# Patient Record
Sex: Male | Born: 1965 | Race: White | Hispanic: No | Smoking: Never smoker
Health system: Southern US, Community
[De-identification: ages and names within clinical notes are randomized; demographics above are authoritative.]

## PROBLEM LIST (undated history)

## (undated) DIAGNOSIS — I10 Essential (primary) hypertension: Secondary | ICD-10-CM

## (undated) DIAGNOSIS — K529 Noninfective gastroenteritis and colitis, unspecified: Secondary | ICD-10-CM

## (undated) DIAGNOSIS — K760 Fatty (change of) liver, not elsewhere classified: Secondary | ICD-10-CM

## (undated) DIAGNOSIS — R0989 Other specified symptoms and signs involving the circulatory and respiratory systems: Secondary | ICD-10-CM

## (undated) DIAGNOSIS — N281 Cyst of kidney, acquired: Secondary | ICD-10-CM

## (undated) DIAGNOSIS — R1904 Left lower quadrant abdominal swelling, mass and lump: Secondary | ICD-10-CM

## (undated) DIAGNOSIS — R51 Headache: Secondary | ICD-10-CM

## (undated) DIAGNOSIS — K5790 Diverticulosis of intestine, part unspecified, without perforation or abscess without bleeding: Secondary | ICD-10-CM

## (undated) DIAGNOSIS — I82409 Acute embolism and thrombosis of unspecified deep veins of unspecified lower extremity: Secondary | ICD-10-CM

## (undated) DIAGNOSIS — R519 Headache, unspecified: Secondary | ICD-10-CM

## (undated) DIAGNOSIS — I2699 Other pulmonary embolism without acute cor pulmonale: Secondary | ICD-10-CM

## (undated) DIAGNOSIS — M7730 Calcaneal spur, unspecified foot: Secondary | ICD-10-CM

## (undated) DIAGNOSIS — R42 Dizziness and giddiness: Secondary | ICD-10-CM

## (undated) DIAGNOSIS — E669 Obesity, unspecified: Secondary | ICD-10-CM

## (undated) HISTORY — PX: OTHER SURGICAL HISTORY: SHX169

---

## 2008-04-24 ENCOUNTER — Ambulatory Visit: Payer: Self-pay | Admitting: Internal Medicine

## 2008-05-07 ENCOUNTER — Ambulatory Visit: Payer: Self-pay | Admitting: General Surgery

## 2008-05-07 ENCOUNTER — Ambulatory Visit: Payer: Self-pay | Admitting: Internal Medicine

## 2008-05-11 ENCOUNTER — Ambulatory Visit: Payer: Self-pay | Admitting: General Surgery

## 2009-10-25 ENCOUNTER — Ambulatory Visit: Payer: Self-pay | Admitting: Internal Medicine

## 2013-03-28 ENCOUNTER — Ambulatory Visit: Payer: Self-pay | Admitting: Family Medicine

## 2013-05-01 ENCOUNTER — Ambulatory Visit: Payer: Self-pay | Admitting: Physician Assistant

## 2014-07-06 ENCOUNTER — Emergency Department: Payer: Self-pay | Admitting: Emergency Medicine

## 2014-07-07 ENCOUNTER — Telehealth (HOSPITAL_BASED_OUTPATIENT_CLINIC_OR_DEPARTMENT_OTHER): Payer: Self-pay | Admitting: Emergency Medicine

## 2014-12-08 ENCOUNTER — Other Ambulatory Visit: Payer: Self-pay | Admitting: Family Medicine

## 2014-12-08 ENCOUNTER — Ambulatory Visit
Admission: RE | Admit: 2014-12-08 | Discharge: 2014-12-08 | Disposition: A | Discharge status: Home / Self Care | Payer: Managed Care, Other (non HMO) | Source: Ambulatory Visit | Attending: Family Medicine | Admitting: Family Medicine

## 2014-12-08 DIAGNOSIS — R42 Dizziness and giddiness: Secondary | ICD-10-CM | POA: Insufficient documentation

## 2014-12-08 MED ORDER — GADOBENATE DIMEGLUMINE 529 MG/ML IV SOLN
20.0000 mL | Freq: Once | INTRAVENOUS | Status: AC | PRN
  Administered 2014-12-08: 20 mL via INTRAVENOUS

## 2014-12-12 ENCOUNTER — Encounter: Payer: Self-pay | Admitting: Emergency Medicine

## 2014-12-12 ENCOUNTER — Inpatient Hospital Stay
Admission: EM | Admit: 2014-12-12 | Discharge: 2014-12-15 | DRG: 418 | Disposition: A | Discharge status: Home / Self Care | Payer: Managed Care, Other (non HMO) | Attending: Internal Medicine | Admitting: Internal Medicine

## 2014-12-12 ENCOUNTER — Emergency Department: Payer: Managed Care, Other (non HMO)

## 2014-12-12 DIAGNOSIS — E669 Obesity, unspecified: Secondary | ICD-10-CM | POA: Diagnosis present

## 2014-12-12 DIAGNOSIS — Z6841 Body Mass Index (BMI) 40.0 and over, adult: Secondary | ICD-10-CM

## 2014-12-12 DIAGNOSIS — Z86711 Personal history of pulmonary embolism: Secondary | ICD-10-CM | POA: Diagnosis not present

## 2014-12-12 DIAGNOSIS — K821 Hydrops of gallbladder: Secondary | ICD-10-CM

## 2014-12-12 DIAGNOSIS — K819 Cholecystitis, unspecified: Secondary | ICD-10-CM | POA: Diagnosis present

## 2014-12-12 DIAGNOSIS — K8050 Calculus of bile duct without cholangitis or cholecystitis without obstruction: Secondary | ICD-10-CM

## 2014-12-12 DIAGNOSIS — R791 Abnormal coagulation profile: Secondary | ICD-10-CM | POA: Diagnosis present

## 2014-12-12 DIAGNOSIS — K8010 Calculus of gallbladder with chronic cholecystitis without obstruction: Principal | ICD-10-CM | POA: Diagnosis present

## 2014-12-12 DIAGNOSIS — R1013 Epigastric pain: Secondary | ICD-10-CM

## 2014-12-12 DIAGNOSIS — K8000 Calculus of gallbladder with acute cholecystitis without obstruction: Secondary | ICD-10-CM | POA: Diagnosis not present

## 2014-12-12 DIAGNOSIS — K8020 Calculus of gallbladder without cholecystitis without obstruction: Secondary | ICD-10-CM | POA: Diagnosis present

## 2014-12-12 DIAGNOSIS — Z7901 Long term (current) use of anticoagulants: Secondary | ICD-10-CM | POA: Diagnosis not present

## 2014-12-12 DIAGNOSIS — Z86718 Personal history of other venous thrombosis and embolism: Secondary | ICD-10-CM | POA: Diagnosis not present

## 2014-12-12 HISTORY — DX: Dizziness and giddiness: R42

## 2014-12-12 HISTORY — DX: Other pulmonary embolism without acute cor pulmonale: I26.99

## 2014-12-12 LAB — COMPREHENSIVE METABOLIC PANEL
ALT: 29 U/L (ref 17–63)
AST: 28 U/L (ref 15–41)
Albumin: 3.7 g/dL (ref 3.5–5.0)
Alkaline Phosphatase: 60 U/L (ref 38–126)
Anion gap: 8 (ref 5–15)
BUN: 18 mg/dL (ref 6–20)
CO2: 28 mmol/L (ref 22–32)
CREATININE: 0.75 mg/dL (ref 0.61–1.24)
Calcium: 9.1 mg/dL (ref 8.9–10.3)
Chloride: 102 mmol/L (ref 101–111)
GFR calc Af Amer: >60 mL/min (ref >60)
GFR calc non Af Amer: >60 mL/min (ref >60)
Glucose, Bld: 120 mg/dL — ABNORMAL HIGH (ref 65–99)
Potassium: 4.3 mmol/L (ref 3.5–5.1)
SODIUM: 138 mmol/L (ref 135–145)
TOTAL PROTEIN: 7.3 g/dL (ref 6.5–8.1)
Total Bilirubin: 0.5 mg/dL (ref 0.3–1.2)

## 2014-12-12 LAB — URINALYSIS COMPLETE WITH MICROSCOPIC (ARMC ONLY)
BILIRUBIN URINE: NEGATIVE
Bacteria, UA: NONE SEEN
Glucose, UA: NEGATIVE mg/dL
HGB URINE DIPSTICK: NEGATIVE
KETONES UR: NEGATIVE mg/dL
Nitrite: NEGATIVE
Protein, ur: NEGATIVE mg/dL
Specific Gravity, Urine: 1.01 (ref 1.005–1.030)
pH: 6 (ref 5.0–8.0)

## 2014-12-12 LAB — HEPARIN LEVEL (UNFRACTIONATED): HEPARIN UNFRACTIONATED: 0.35 [IU]/mL (ref 0.30–0.70)

## 2014-12-12 LAB — HEMOGLOBIN A1C: Hgb A1c MFr Bld: 6 % (ref 4.0–6.0)

## 2014-12-12 LAB — CBC WITH DIFFERENTIAL/PLATELET
Basophils Absolute: 0 10*3/uL (ref 0–0.1)
Basophils Relative: 0 %
EOS ABS: 0.3 10*3/uL (ref 0–0.7)
Eosinophils Relative: 3 %
HCT: 45.8 % (ref 40.0–52.0)
HEMOGLOBIN: 15.2 g/dL (ref 13.0–18.0)
LYMPHS ABS: 4.1 10*3/uL — AB (ref 1.0–3.6)
Lymphocytes Relative: 36 %
MCH: 27.7 pg (ref 26.0–34.0)
MCHC: 33.1 g/dL (ref 32.0–36.0)
MCV: 83.7 fL (ref 80.0–100.0)
Monocytes Absolute: 1 10*3/uL (ref 0.2–1.0)
Monocytes Relative: 8 %
Neutro Abs: 6.1 10*3/uL (ref 1.4–6.5)
Neutrophils Relative %: 53 %
Platelets: 236 10*3/uL (ref 150–440)
RBC: 5.47 MIL/uL (ref 4.40–5.90)
RDW: 14.1 % (ref 11.5–14.5)
WBC: 11.6 10*3/uL — ABNORMAL HIGH (ref 3.8–10.6)

## 2014-12-12 LAB — PROTIME-INR
INR: 2.28
PROTHROMBIN TIME: 25.3 s — AB (ref 11.4–15.0)

## 2014-12-12 LAB — TROPONIN I: Troponin I: <0.03 ng/mL (ref <0.031)

## 2014-12-12 LAB — LIPASE, BLOOD: LIPASE: 29 U/L (ref 22–51)

## 2014-12-12 LAB — TSH: TSH: 4.211 u[IU]/mL (ref 0.350–4.500)

## 2014-12-12 MED ORDER — ACETAMINOPHEN 650 MG RE SUPP: 650.0000 mg | Freq: Four times a day (QID) | RECTAL | Status: DC | PRN

## 2014-12-12 MED ORDER — WARFARIN SODIUM 5 MG PO TABS: 8.0000 mg | ORAL_TABLET | ORAL | Status: DC

## 2014-12-12 MED ORDER — HEPARIN BOLUS VIA INFUSION
4000.0000 [IU] | Freq: Once | INTRAVENOUS | Status: AC
  Administered 2014-12-12: 4000 [IU] via INTRAVENOUS
  Filled 2014-12-12: qty 4000

## 2014-12-12 MED ORDER — ACETAMINOPHEN 325 MG PO TABS
650.0000 mg | ORAL_TABLET | Freq: Four times a day (QID) | ORAL | Status: DC | PRN
  Administered 2014-12-12: 650 mg via ORAL
  Filled 2014-12-12: qty 2

## 2014-12-12 MED ORDER — HEPARIN (PORCINE) IN NACL 100-0.45 UNIT/ML-% IJ SOLN
2000.0000 [IU]/h | INTRAMUSCULAR | Status: DC
  Administered 2014-12-12 – 2014-12-14 (×4): 2000 [IU]/h via INTRAVENOUS
  Filled 2014-12-12 (×10): qty 250

## 2014-12-12 MED ORDER — ONDANSETRON HCL 4 MG PO TABS: 4.0000 mg | ORAL_TABLET | Freq: Four times a day (QID) | ORAL | Status: DC | PRN

## 2014-12-12 MED ORDER — DOCUSATE SODIUM 100 MG PO CAPS
100.0000 mg | ORAL_CAPSULE | Freq: Two times a day (BID) | ORAL | Status: DC
Start: 2014-12-12 — End: 2014-12-15
  Administered 2014-12-12 – 2014-12-14 (×5): 100 mg via ORAL
  Filled 2014-12-12 (×5): qty 1

## 2014-12-12 MED ORDER — MORPHINE SULFATE 2 MG/ML IJ SOLN
2.0000 mg | INTRAMUSCULAR | Status: DC | PRN
  Administered 2014-12-12 – 2014-12-15 (×2): 2 mg via INTRAVENOUS
  Filled 2014-12-12 (×2): qty 1

## 2014-12-12 MED ORDER — GI COCKTAIL ~~LOC~~
30.0000 mL | Freq: Once | ORAL | Status: AC
  Administered 2014-12-12: 30 mL via ORAL
  Filled 2014-12-12: qty 30

## 2014-12-12 MED ORDER — ONDANSETRON HCL 4 MG/2ML IJ SOLN
4.0000 mg | Freq: Once | INTRAMUSCULAR | Status: AC
  Administered 2014-12-12: 4 mg via INTRAVENOUS
  Filled 2014-12-12: qty 2

## 2014-12-12 MED ORDER — MORPHINE SULFATE 4 MG/ML IJ SOLN
4.0000 mg | Freq: Once | INTRAMUSCULAR | Status: AC
  Administered 2014-12-12: 4 mg via INTRAVENOUS
  Filled 2014-12-12: qty 1

## 2014-12-12 MED ORDER — WARFARIN SODIUM 2 MG PO TABS: 4.0000 mg | ORAL_TABLET | ORAL | Status: DC

## 2014-12-12 MED ORDER — SODIUM CHLORIDE 0.9 % IV SOLN
INTRAVENOUS | Status: DC
  Administered 2014-12-12 – 2014-12-14 (×4): via INTRAVENOUS
  Administered 2014-12-14: 1000 mL via INTRAVENOUS
  Administered 2014-12-14 – 2014-12-15 (×2): via INTRAVENOUS

## 2014-12-12 MED ORDER — WARFARIN SODIUM 5 MG PO TABS: 8.0000 mg | ORAL_TABLET | Freq: Every day | ORAL | Status: DC

## 2014-12-12 MED ORDER — ONDANSETRON HCL 4 MG/2ML IJ SOLN
4.0000 mg | Freq: Four times a day (QID) | INTRAMUSCULAR | Status: DC | PRN
  Administered 2014-12-15: 4 mg via INTRAVENOUS

## 2014-12-12 NOTE — ED Notes (Signed)
Diamond MD at bedside. 

## 2014-12-12 NOTE — ED Notes (Signed)
Report received from Rachel RN.  

## 2014-12-12 NOTE — Consult Note (Signed)
CC: RUQ pain  HPI: Russell Walters is a pleasant 49 yo M with a history of PE on coumadin who presents with approx 11 hours of acute onset RUQ pain.  Has never had before.  Has improved with morphine.  No fevers/chills, night sweats, shortness of breath, cough, chest pain, nausea/vomiting, diarrhea/constipation, dysuria/hematuria.  No sick contacts, no unusual ingestions.  Active Ambulatory Problems    Diagnosis Date Noted  . No Active Ambulatory Problems   Resolved Ambulatory Problems    Diagnosis Date Noted  . No Resolved Ambulatory Problems   Past Medical History  Diagnosis Date  . Pulmonary embolism   . Vertigo      Medication List    ASK your doctor about these medications        WARFARIN SODIUM PO  Take 4 mg by mouth every Friday.     WARFARIN SODIUM PO  Take 8 mg by mouth daily. Take every day except Friday       Family History  Problem Relation Age of Onset  . Diabetes Mellitus II Maternal Grandmother    History   Social History  . Marital Status: Divorced    Spouse Name: N/A  . Number of Children: N/A  . Years of Education: N/A   Occupational History  . Not on file.   Social History Main Topics  . Smoking status: Never Smoker   . Smokeless tobacco: Not on file  . Alcohol Use: 0.0 oz/week    0 Standard drinks or equivalent per week     Comment: Occasional  . Drug Use: No  . Sexual Activity: Not on file   Other Topics Concern  . Not on file   Social History Narrative   Lives with his wife   No Known Allergies   ROS: Full ROS obtained, pertinent positives and negatives as above.  Blood pressure 140/82, pulse 53, temperature 97.8 F (36.6 C), temperature source Oral, resp. rate 17, height  (1.854 m), weight 162.841 kg (359 lb), SpO2 99 %. GEN: NAD/A&Ox3 FACE: no obvious facial trauma, normal external nose, normal external ears EYES: no scleral icterus, no conjunctivitis HEAD: normocephalic atraumatic CV: RRR, no MRG RESP: moving air  well, lungs clear ABD: soft, mild tender to palp RUQ, nondistended EXT: moving all ext well, strength 5/5 NEURO: cnII-XII grossly intact, sensation intact all 4 ext  Labs: Personally reviewed, significant for WBC 11.6, 53% neutrophils INR: Not drawn  Imaging: reviewed, significant for nonmobile stone at neck of gallbladder  A/P 49 yo with likely gallbladder hydrops (in contrast to choledocholithiasis, which has been stated in ER report and H and P.  There is no cbd dilatation or elevated bili, which would be seen with choledocholithiasis).  Have recommended laparoscopic cholecystectomy.  However, on coumadin and INR not known.  Will draw INR, would like to have below 1.6 prior to OR.  If > 1.6, can allow to eat and will make NPO after midnight and check again.

## 2014-12-12 NOTE — Progress Notes (Signed)
[facility] -  at [facility]   PATIENT NAME: Russell Walters    MR#:  [id number]  DATE OF BIRTH:  1965-07-01  SUBJECTIVE:  CHIEF COMPLAINT:   Chief Complaint  Patient presents with  . Abdominal Pain    Pt presents to ER alert and in NAD. Pt states epigastric pain that started this evening. Pt denies n/v/d. Resp even and unlabored.  RUQ pain  REVIEW OF SYSTEMS:  CONSTITUTIONAL: No fever, fatigue or weakness.  EYES: No blurred or double vision.  EARS, NOSE, AND THROAT: No tinnitus or ear pain.  RESPIRATORY: No cough, shortness of breath, wheezing or hemoptysis.  CARDIOVASCULAR: No chest pain, orthopnea, edema.  GASTROINTESTINAL: No nausea, vomiting, diarrhea but has RUQ abdominal pain.  GENITOURINARY: No dysuria, hematuria.  ENDOCRINE: No polyuria, nocturia,  HEMATOLOGY: No anemia, easy bruising or bleeding SKIN: No rash or lesion. MUSCULOSKELETAL: No joint pain or arthritis.   NEUROLOGIC: No tingling, numbness, weakness.  PSYCHIATRY: No anxiety or depression.   DRUG ALLERGIES:  No Known Allergies  VITALS:  Blood pressure 140/82, pulse 53, temperature 97.8 F (36.6 C), temperature source Oral, resp. rate 17, height 6\' 1"  (1.854 m), weight 162.841 kg (359 lb), SpO2 99 %.  PHYSICAL EXAMINATION:  GENERAL:  49 y.o.-year-old patient lying in the bed with no acute distress.  EYES: Pupils equal, round, reactive to light and accommodation. No scleral icterus. Extraocular muscles intact.  HEENT: Head atraumatic, normocephalic. Oropharynx and nasopharynx clear.  NECK:  Supple, no jugular venous distention. No thyroid enlargement, no tenderness.  LUNGS: Normal breath sounds bilaterally, no wheezing, rales,rhonchi or crepitation. No use of accessory muscles of respiration.  CARDIOVASCULAR: S1, S2 normal. No murmurs, rubs, or gallops.  ABDOMEN: Soft, tenderness on RUQ,  nondistended. Bowel sounds present. No organomegaly or mass.  EXTREMITIES: No pedal  edema, cyanosis, or clubbing.  NEUROLOGIC: Cranial nerves II through XII are intact. Muscle strength 5/5 in all extremities. Sensation intact. Gait not checked.  PSYCHIATRIC: The patient is alert and oriented x 3.  SKIN: No obvious rash, lesion, or ulcer.    LABORATORY PANEL:   CBC  Recent Labs Lab 12/12/14 0210  WBC 11.6*  HGB 15.2  HCT 45.8  PLT 236   ------------------------------------------------------------------------------------------------------------------  Chemistries   Recent Labs Lab 12/12/14 0210  NA 138  K 4.3  CL 102  CO2 28  GLUCOSE 120*  BUN 18  CREATININE 0.75  CALCIUM 9.1  AST 28  ALT 29  ALKPHOS 60  BILITOT 0.5   ------------------------------------------------------------------------------------------------------------------  Cardiac Enzymes  Recent Labs Lab 12/12/14 0210  TROPONINI <0.03   ------------------------------------------------------------------------------------------------------------------  RADIOLOGY:  US Abdomen Limited Ruq  12/12/2014   CLINICAL DATA:  Epigastric pain for 6 hours.  EXAM: US ABDOMEN LIMITED - RIGHT UPPER QUADRANT  COMPARISON:  None.  FINDINGS: Gallbladder:  Non mobile gallstone in the neck measures 1.5 cm. No wall thickening, wall thickness of 2.4 mm. No sonographic Eulah Pont sign noted, however patient has been administered pain medication.  Common bile duct:  Diameter: 4 mm  Liver:  No focal lesion identified. Diffusely increased and heterogeneous in parenchymal echogenicity.  Technically limited and difficult exam due to large body habitus.  IMPRESSION: 1. Non mobile gallstone in the neck. No wall thickening. Negative sonographic Murphy sign, however limited assessment given administration of pain medication. No biliary dilatation. Nuclear medicine HIDA scan could be considered for further evaluation if there is clinical concern for acute cholecystitis. 2. Hepatic steatosis.   Electronically Signed  By: Rubye Oaks M.D.   On: 12/12/2014 05:22    EKG:   Orders placed or performed during the hospital encounter of 12/12/14  . ED EKG  . ED EKG    ASSESSMENT AND PLAN:   Cholelithiasis.  would like to have INR below 1.6 and then get laparoscopic cholecystectomy per Dr. Juliann Pulse.  H/O PE. D/c coumadin, start heparin drip. F/u PTT, INR, D/c heparin drip before surgery.   D/w Dr. Juliann Pulse.  All the records are reviewed and case discussed with Care Management/Social Workerr. Management plans discussed with the patient, family and they are in agreement.  CODE STATUS: Full code.  TOTAL TIME TAKING CARE OF THIS PATIENT: 39  minutes.   POSSIBLE D/C IN 3 DAYS, DEPENDING ON CLINICAL CONDITION.   Shaune Pollack M.D on 12/12/2014 at 2:35 PM  Between 7am to 6pm - Pager - [phone]  After 6pm go to [email or web address] - password [email or web address]  [facility]  Office  [phone]  CC: Primary care physician; Rayetta Humphrey, MD

## 2014-12-12 NOTE — ED Notes (Signed)
Pt presents to ER alert and in NAD. Pt states epigastric pain that started this evening. Pt denies n/v/d. Resp even and unlabored.

## 2014-12-12 NOTE — ED Provider Notes (Signed)
[facility] Emergency Department Provider Note  ____________________________________________  Time seen: Approximately 245 AM  I have reviewed the triage vital signs and the nursing notes.   HISTORY  Chief Complaint Abdominal Pain    HPI Russell Walters is a 49 y.o. male who comes in with abdominal pain. The patient reports that he was walking his dogs at 2230 when he started having some epigastric pain. The patient reports it as a squeezing pain that goes from 5-10 out of 10 pain. The patient reports that he feels pressure in his upper stomach and in his chest. The symptoms seem worse when he lays on his right side. The patient tried some Pepto-Bismol and Tylenol without any relief. The patient was able to eat dinner without having any difficulty or pain at the time. The patient reports that he does have some radiation into his back and into his side. He has never had this pain before and he is concerned he came into the hospital for further evaluation.   Past Medical History  Diagnosis Date  . Pulmonary embolism   . Vertigo     There are no active problems to display for this patient.   History reviewed. No pertinent past surgical history.  Current Outpatient Rx  Name  Route  Sig  Dispense  Refill  . WARFARIN SODIUM PO   Oral   Take 4 mg by mouth every Friday.         Barron Alvine SODIUM PO   Oral   Take 8 mg by mouth daily. Take every day except Friday           Allergies Review of patient's allergies indicates no known allergies.  History reviewed. No pertinent family history.  Social History History  Substance Use Topics  . Smoking status: Never Smoker   . Smokeless tobacco: Not on file  . Alcohol Use: No    Review of Systems Constitutional: No fever/chills Eyes: No visual changes. ENT: No sore throat. Cardiovascular: chest pain. Respiratory:  shortness of breath. Gastrointestinal: Abdominal pain and nausea no vomiting.   No diarrhea.  No constipation. Genitourinary: Negative for dysuria. Musculoskeletal: back pain. Skin: Negative for rash. Neurological: Negative for headaches, focal weakness or numbness.  10-point ROS otherwise negative.  ____________________________________________   PHYSICAL EXAM:  VITAL SIGNS: ED Triage Vitals  Enc Vitals Group     BP 12/12/14 0226 152/90 mmHg     Pulse Rate 12/12/14 0226 68     Resp 12/12/14 0226 21     Temp 12/12/14 0226 98.1 F (36.7 C)     Temp Source 12/12/14 0226 Oral     SpO2 12/12/14 0226 96 %     Weight --      Height --      Head Cir --      Peak Flow --      Pain Score 12/12/14 0216 5     Pain Loc --      Pain Edu? --      Excl. in GC? --     Constitutional: Alert and oriented. Well appearing and in no acute distress. Eyes: Conjunctivae are normal. PERRL. EOMI. Head: Atraumatic. Nose: No congestion/rhinnorhea. Mouth/Throat: Mucous membranes are moist.  Oropharynx non-erythematous. Cardiovascular: Normal rate, regular rhythm. Grossly normal heart sounds.  Good peripheral circulation. Respiratory: Normal respiratory effort.  No retractions. Lungs CTAB. Gastrointestinal: Soft with epigastric tenderness to palpation, no distention, positive bowel sounds Genitourinary: Deferred Musculoskeletal: No lower extremity tenderness nor edema.  Neurologic:  Normal speech and language.  Skin:  Skin is warm, dry and intact.  Psychiatric: Mood and affect are normal.   ____________________________________________   LABS (all labs ordered are listed, but only abnormal results are displayed)  Labs Reviewed  COMPREHENSIVE METABOLIC PANEL - Abnormal; Notable for the following:    Glucose, Bld 120 (*)    All other components within normal limits  CBC WITH DIFFERENTIAL/PLATELET - Abnormal; Notable for the following:    WBC 11.6 (*)    Lymphs Abs 4.1 (*)    All other components within normal limits  URINALYSIS COMPLETEWITH MICROSCOPIC ([facility] ONLY) -  Abnormal; Notable for the following:    Color, Urine YELLOW (*)    APPearance CLEAR (*)    Leukocytes, UA TRACE (*)    Squamous Epithelial / LPF 0-5 (*)    All other components within normal limits  LIPASE, BLOOD  TROPONIN I   ____________________________________________  EKG  ED ECG REPORT I, Rebecka Apley, the attending physician, personally viewed and interpreted this ECG.   Date: 12/12/2014  EKG Time: 200  Rate: 74  Rhythm: normal EKG, normal sinus rhythm, unchanged from previous tracings, normal sinus rhythm  Axis: normal  Intervals:none  ST&T Change: none  ____________________________________________  RADIOLOGY  Abdominal ultrasound: Nonmobile gallstone in the neck, no wall thickening, negative sonographic Murphy sign, nuclear medicine HIDA scan could be considered for further evaluation if there is clinical concern for acute cholecystitis. ____________________________________________   PROCEDURES  Procedure(s) performed: None  Critical Care performed: No  ____________________________________________   INITIAL IMPRESSION / ASSESSMENT AND PLAN / ED COURSE  Pertinent labs & imaging results that were available during my care of the patient were reviewed by me and considered in my medical decision making (see chart for details).  The patient is a 49 year old male who comes in with abdominal pain and has a nonmobile stone in his gallbladder neck. At this time the patient appears to have choledocholithiasis. I will admit him to the hospital for an ERCP and further evaluation by GI and surgery. The patient did receive some morphine and he reports that his pain is improved at this time. I discussed the patient's case with the hospitalist who will admit the patient to the hospital service. ____________________________________________   FINAL CLINICAL IMPRESSION(S) / ED DIAGNOSES  Final diagnoses:  Epigastric pain  Choledocholithiasis      Rebecka Apley, MD 12/12/14 [phone]

## 2014-12-12 NOTE — Progress Notes (Addendum)
ANTICOAGULATION CONSULT NOTE - Initial Consult  Pharmacy Consult for Heparin  Indication: DVT  No Known Allergies  Patient Measurements: Height:  (185.4 cm) Weight: (!) 359 lb (162.841 kg) IBW/kg (Calculated) : 79.9 Heparin Dosing Weight: 118.8 kg  Vital Signs: Temp: 97.8 F (36.6 C) (07/30 0848) Temp Source: Oral (07/30 0848) BP: 140/82 mmHg (07/30 0848) Pulse Rate: 53 (07/30 0848)  Labs:  Recent Labs  12/12/14 0210  HGB 15.2  HCT 45.8  PLT 236  LABPROT 25.3*  INR 2.28  CREATININE 0.75  TROPONINI <0.03    Estimated Creatinine Clearance: 182.6 mL/min (by C-G formula based on Cr of 0.75).   Medical History: Past Medical History  Diagnosis Date  . Pulmonary embolism   . Vertigo     Medications:  Prescriptions prior to admission  Medication Sig Dispense Refill Last Dose  . WARFARIN SODIUM PO Take 4 mg by mouth every Friday.   12/11/2014 at 2300  . WARFARIN SODIUM PO Take 8 mg by mouth daily. Take every day except Friday   Past Week at Unknown time    Assessment: Pt with VTE  Goal of Therapy:  Heparin level 0.3-0.7 units/ml Monitor platelets by anticoagulation protocol: Yes   Plan:  Will order Heparin 4000 units IV X 1 bolus. Will start Heparin drip 2000 units/hr.  7/30:  HL @ 19:00 = 0.35  Will continue this pt on current rate of 2000 units/hr.  Will draw confirmation HL 6 hrs after 1st level on 7/31 @ 1:00.   Leshay Desaulniers D 12/12/2014,10:58 AM

## 2014-12-12 NOTE — H&P (Signed)
Russell Walters is an 49 y.o. male.   Chief Complaint: Abdominal pain HPI: The patient presents emergency department complaining of gradually worsening abdominal pain that began the night of admission. He denies nausea vomiting or diarrhea. The patient also denies fevers but had some pain to radiate substernally from the original location in his abdomen. He admits to some pain in his lateral back bilaterally. In the emergency department ultrasound of the abdomen revealed choledocholithiasis which prompted the emergency department to call for admission.  Past Medical History  Diagnosis Date  . Pulmonary embolism   . Vertigo     Past Surgical History  Procedure Laterality Date  . Breast duct removal Right     Family History  Problem Relation Age of Onset  . Diabetes Mellitus II Maternal Grandmother    Social History:  reports that he has never smoked. He does not have any smokeless tobacco history on file. He reports that he drinks alcohol. He reports that he does not use illicit drugs.  Allergies: No Known Allergies  Prior to Admission medications   Medication Sig Start Date End Date Taking? Authorizing Provider  WARFARIN SODIUM PO Take 4 mg by mouth every Friday.   Yes Historical Provider, MD  WARFARIN SODIUM PO Take 8 mg by mouth daily. Take every day except Friday   Yes Historical Provider, MD     Results for orders placed or performed during the hospital encounter of 12/12/14 (from the past 48 hour(s))  Lipase, blood     Status: None   Collection Time: 12/12/14  2:10 AM  Result Value Ref Range   Lipase 29 22 - 51 U/L  Comprehensive metabolic panel     Status: Abnormal   Collection Time: 12/12/14  2:10 AM  Result Value Ref Range   Sodium 138 135 - 145 mmol/L   Potassium 4.3 3.5 - 5.1 mmol/L   Chloride 102 101 - 111 mmol/L   CO2 28 22 - 32 mmol/L   Glucose, Bld 120 (H) 65 - 99 mg/dL   BUN 18 6 - 20 mg/dL   Creatinine, Ser 0.75 0.61 - 1.24 mg/dL   Calcium 9.1 8.9 -  10.3 mg/dL   Total Protein 7.3 6.5 - 8.1 g/dL   Albumin 3.7 3.5 - 5.0 g/dL   AST 28 15 - 41 U/L   ALT 29 17 - 63 U/L   Alkaline Phosphatase 60 38 - 126 U/L   Total Bilirubin 0.5 0.3 - 1.2 mg/dL   GFR calc non Af Amer >60 >60 mL/min   GFR calc Af Amer >60 >60 mL/min    Comment: (NOTE) The eGFR has been calculated using the CKD EPI equation. This calculation has not been validated in all clinical situations. eGFR's persistently <60 mL/min signify possible Chronic Kidney Disease.    Anion gap 8 5 - 15  CBC WITH DIFFERENTIAL     Status: Abnormal   Collection Time: 12/12/14  2:10 AM  Result Value Ref Range   WBC 11.6 (H) 3.8 - 10.6 K/uL   RBC 5.47 4.40 - 5.90 MIL/uL   Hemoglobin 15.2 13.0 - 18.0 g/dL   HCT 45.8 40.0 - 52.0 %   MCV 83.7 80.0 - 100.0 fL   MCH 27.7 26.0 - 34.0 pg   MCHC 33.1 32.0 - 36.0 g/dL   RDW 14.1 11.5 - 14.5 %   Platelets 236 150 - 440 K/uL   Neutrophils Relative % 53 %   Neutro Abs 6.1 1.4 - 6.5 K/uL  Lymphocytes Relative 36 %   Lymphs Abs 4.1 (H) 1.0 - 3.6 K/uL   Monocytes Relative 8 %   Monocytes Absolute 1.0 0.2 - 1.0 K/uL   Eosinophils Relative 3 %   Eosinophils Absolute 0.3 0 - 0.7 K/uL   Basophils Relative 0 %   Basophils Absolute 0.0 0 - 0.1 K/uL  Troponin I     Status: None   Collection Time: 12/12/14  2:10 AM  Result Value Ref Range   Troponin I <0.03 <0.031 ng/mL    Comment:        NO INDICATION OF MYOCARDIAL INJURY.   Urinalysis complete, with microscopic ([facility] only)     Status: Abnormal   Collection Time: 12/12/14  2:11 AM  Result Value Ref Range   Color, Urine YELLOW (A) YELLOW   APPearance CLEAR (A) CLEAR   Glucose, UA NEGATIVE NEGATIVE mg/dL   Bilirubin Urine NEGATIVE NEGATIVE   Ketones, ur NEGATIVE NEGATIVE mg/dL   Specific Gravity, Urine 1.010 1.005 - 1.030   Hgb urine dipstick NEGATIVE NEGATIVE   pH 6.0 5.0 - 8.0   Protein, ur NEGATIVE NEGATIVE mg/dL   Nitrite NEGATIVE NEGATIVE   Leukocytes, UA TRACE (A) NEGATIVE   RBC /  HPF 0-5 0 - 5 RBC/hpf   WBC, UA 0-5 0 - 5 WBC/hpf   Bacteria, UA NONE SEEN NONE SEEN   Squamous Epithelial / LPF 0-5 (A) NONE SEEN   US Abdomen Limited Ruq  12/12/2014   CLINICAL DATA:  Epigastric pain for 6 hours.  EXAM: US ABDOMEN LIMITED - RIGHT UPPER QUADRANT  COMPARISON:  None.  FINDINGS: Gallbladder:  Non mobile gallstone in the neck measures 1.5 cm. No wall thickening, wall thickness of 2.4 mm. No sonographic Percell Miller sign noted, however patient has been administered pain medication.  Common bile duct:  Diameter: 4 mm  Liver:  No focal lesion identified. Diffusely increased and heterogeneous in parenchymal echogenicity.  Technically limited and difficult exam due to large body habitus.  IMPRESSION: 1. Non mobile gallstone in the neck. No wall thickening. Negative sonographic Murphy sign, however limited assessment given administration of pain medication. No biliary dilatation. Nuclear medicine HIDA scan could be considered for further evaluation if there is clinical concern for acute cholecystitis. 2. Hepatic steatosis.   Electronically Signed   By: Jeb Levering M.D.   On: 12/12/2014 05:22    Review of Systems  Constitutional: Negative for fever and chills.  HENT: Negative for sore throat and tinnitus.   Eyes: Negative for blurred vision and redness.  Respiratory: Negative for cough and shortness of breath.   Cardiovascular: Negative for chest pain, palpitations, orthopnea and PND.  Gastrointestinal: Positive for abdominal pain. Negative for nausea, vomiting and diarrhea.  Genitourinary: Negative for dysuria, urgency and frequency.  Musculoskeletal: Negative for myalgias and joint pain.  Skin: Negative for rash.       No lesions  Neurological: Negative for speech change, focal weakness and weakness.  Endo/Heme/Allergies: Does not bruise/bleed easily.       No temperature intolerance  Psychiatric/Behavioral: Negative for depression and suicidal ideas.    Blood pressure 126/77, pulse  62, temperature 98.1 F (36.7 C), temperature source Oral, resp. rate 16, SpO2 96 %. Physical Exam  Nursing note and vitals reviewed. Constitutional: He is oriented to person, place, and time. He appears well-developed and well-nourished. No distress.  HENT:  Head: Normocephalic and atraumatic.  Mouth/Throat: Oropharynx is clear and moist. No oropharyngeal exudate.  Eyes: Conjunctivae and EOM are normal.  Pupils are equal, round, and reactive to light. No scleral icterus.  Neck: Normal range of motion. Neck supple. No JVD present. No tracheal deviation present. No thyromegaly present.  Cardiovascular: Normal rate, regular rhythm, normal heart sounds and intact distal pulses.  Exam reveals no gallop and no friction rub.   No murmur heard. Respiratory: Effort normal and breath sounds normal. No respiratory distress.  GI: Soft. Bowel sounds are normal. He exhibits no distension and no mass. There is tenderness. There is no rebound and no guarding.  Genitourinary:  Deferred  Musculoskeletal: Normal range of motion. He exhibits no edema.  Lymphadenopathy:    He has no cervical adenopathy.  Neurological: He is alert and oriented to person, place, and time. No cranial nerve deficit.  Skin: Skin is warm and dry. No rash noted. No erythema.  Psychiatric: He has a normal mood and affect. His behavior is normal. Judgment and thought content normal.     Assessment/Plan This is a 49 year old Caucasian male admitted for choledocholithiasis. 1. Choledocholithiasis: Surgery consult pending. She barely has leukocytosis and no pancreatitis. Defer antibiotics for now. Manage pain. 2. History of DVT/PE: Obtain INR. Defer to surgery for a coagulation recommendations prior to potential ERCP. 3. Obesity: Pain height in order to calculate BMI; encourage healthy diet and exercise. Check hemoglobin A1c 4. DVT prophylaxis: As above 5. GI prophylaxis: None The patient is a full code. Time spent on admission orders  and patient care approximately 35 minutes  Harrie Foreman 12/12/2014, 7:38 AM

## 2014-12-13 DIAGNOSIS — K821 Hydrops of gallbladder: Secondary | ICD-10-CM

## 2014-12-13 LAB — PROTIME-INR
INR: 2.1
Prothrombin Time: 23.7 seconds — ABNORMAL HIGH (ref 11.4–15.0)

## 2014-12-13 LAB — HEPARIN LEVEL (UNFRACTIONATED): HEPARIN UNFRACTIONATED: 0.38 [IU]/mL (ref 0.30–0.70)

## 2014-12-13 MED ORDER — ZOLPIDEM TARTRATE 5 MG PO TABS
5.0000 mg | ORAL_TABLET | Freq: Every evening | ORAL | Status: DC | PRN
  Administered 2014-12-13 – 2014-12-14 (×2): 5 mg via ORAL
  Filled 2014-12-13 (×2): qty 1

## 2014-12-13 NOTE — Progress Notes (Signed)
Surgery Progress Note  S: Pain improved O:Blood pressure 132/80, pulse 65, temperature 98.3 F (36.8 C), temperature source Oral, resp. rate 17, height  (1.854 m), weight 163.794 kg (361 lb 1.6 oz), SpO2 97 %. GEN: NAD/A&Ox3 ABD: soft, min tender, nondistended  Labs: INR 2.1  A/P 49 yo with probable gallbladder hydrops, on coumadin for PE, inr slowly improving - await INR 1.6 or less prior to cholecystectomy

## 2014-12-13 NOTE — Care Management Note (Signed)
Case Management Note  Patient Details  Name: Russell Walters MRN: [id number] Date of Birth: March 22, 1966  Subjective/Objective:    49yo Mr Russell Walters was admitted with abdominal pain and was diagnosed with Gallbladder Hydrops. Hx of pulmonary embolism and takes coumadin at home. Also uses inflatable boots on his feet and legs at home per hx of PE. Has a rolling walker at home. Resides with Russell Walters [phone]. PCP=Dr [employer]. Pharmacy=[facility] in [place]. Denies ever having had home health services in the past. Current INR=2.10. Dr. Juliann Pulse is recommending a laparoscopic cholecystectomy when INR is less than 1.6.  Case Management will follow for discharge planning needs. No home health needs anticipated at this time.              Action/Plan:   Expected Discharge Date:                  Expected Discharge Plan:     In-House Referral:     Discharge planning Services     Post Acute Care Choice:    Choice offered to:     DME Arranged:    DME Agency:     HH Arranged:    HH Agency:     Status of Service:     Medicare Important Message Given:    Date Medicare IM Given:    Medicare IM give by:    Date Additional Medicare IM Given:    Additional Medicare Important Message give by:     If discussed at Long Length of Stay Meetings, dates discussed:    Additional Comments:  Russell Walters A, RN 12/13/2014, 4:17 PM

## 2014-12-13 NOTE — Progress Notes (Signed)
ANTICOAGULATION CONSULT NOTE - Initial Consult  Pharmacy Consult for Heparin  Indication: DVT  No Known Allergies  Patient Measurements: Height:  (185.4 cm) Weight: (!) 359 lb (162.841 kg) IBW/kg (Calculated) : 79.9 Heparin Dosing Weight: 118.8 kg  Vital Signs: Temp: 98 F (36.7 C) (07/31 0004) Temp Source: Oral (07/31 0004) BP: 121/67 mmHg (07/31 0004) Pulse Rate: 65 (07/31 0004)  Labs:  Recent Labs  12/12/14 0210 12/12/14 1857 12/13/14 0057  HGB 15.2  --   --   HCT 45.8  --   --   PLT 236  --   --   LABPROT 25.3*  --   --   INR 2.28  --   --   HEPARINUNFRC  --  0.35 0.38  CREATININE 0.75  --   --   TROPONINI <0.03  --   --     Estimated Creatinine Clearance: 182.6 mL/min (by C-G formula based on Cr of 0.75).   Medical History: Past Medical History  Diagnosis Date  . Pulmonary embolism   . Vertigo     Medications:  Prescriptions prior to admission  Medication Sig Dispense Refill Last Dose  . WARFARIN SODIUM PO Take 4 mg by mouth every Friday.   12/11/2014 at 2300  . WARFARIN SODIUM PO Take 8 mg by mouth daily. Take every day except Friday   Past Week at Unknown time    Assessment: Pt with VTE  Goal of Therapy:  Heparin level 0.3-0.7 units/ml Monitor platelets by anticoagulation protocol: Yes   Plan:  Will order Heparin 4000 units IV X 1 bolus. Will start Heparin drip 2000 units/hr.  7/30:  HL @ 19:00 = 0.35  Will continue this pt on current rate of 2000 units/hr.  Will draw confirmation HL 6 hrs after 1st level on 7/31 @ 1:00.   7/31 01:00 anti-Xa 0.38. Continue current regimen. CBC and anti-Xa tomorrow AM.  Kylian Loh S 12/13/2014,1:47 AM

## 2014-12-13 NOTE — Progress Notes (Signed)
[facility] - [place] at [facility]   PATIENT NAME: Russell Walters    MR#:  [id number]  DATE OF BIRTH:  April 16, 1966  SUBJECTIVE:  CHIEF COMPLAINT:   Chief Complaint  Patient presents with  . Abdominal Pain    Pt presents to ER alert and in NAD. Pt states epigastric pain that started this evening. Pt denies n/v/d. Resp even and unlabored.  No abd pain at this time.  REVIEW OF SYSTEMS:  CONSTITUTIONAL: No fever, fatigue or weakness.  EYES: No blurred or double vision.  EARS, NOSE, AND THROAT: No tinnitus or ear pain.  RESPIRATORY: No cough, shortness of breath, wheezing or hemoptysis.  CARDIOVASCULAR: No chest pain, orthopnea, edema.  GASTROINTESTINAL: No nausea, vomiting, diarrhea but had abdominal pain.  GENITOURINARY: No dysuria, hematuria.  ENDOCRINE: No polyuria, nocturia,  HEMATOLOGY: No anemia, easy bruising or bleeding SKIN: No rash or lesion. MUSCULOSKELETAL: No joint pain or arthritis.   NEUROLOGIC: No tingling, numbness, weakness.  PSYCHIATRY: No anxiety or depression.   DRUG ALLERGIES:  No Known Allergies  VITALS:  Blood pressure 132/80, pulse 65, temperature 98.3 F (36.8 C), temperature source Oral, resp. rate 17, height 6\' 1"  (1.854 m), weight 163.794 kg (361 lb 1.6 oz), SpO2 97 %.  PHYSICAL EXAMINATION:  GENERAL:  49 y.o.-year-old patient lying in the bed with no acute distress. obese. EYES: Pupils equal, round, reactive to light and accommodation. No scleral icterus. Extraocular muscles intact.  HEENT: Head atraumatic, normocephalic. Oropharynx and nasopharynx clear.  NECK:  Supple, no jugular venous distention. No thyroid enlargement, no tenderness.  LUNGS: Normal breath sounds bilaterally, no wheezing, rales,rhonchi or crepitation. No use of accessory muscles of respiration.  CARDIOVASCULAR: S1, S2 normal. No murmurs, rubs, or gallops.  ABDOMEN: Soft, no tenderness, nondistended. Bowel sounds present. No organomegaly or mass.   EXTREMITIES: No pedal edema, cyanosis, or clubbing.  NEUROLOGIC: Cranial nerves II through XII are intact. Muscle strength 5/5 in all extremities. Sensation intact. Gait not checked.  PSYCHIATRIC: The patient is alert and oriented x 3.  SKIN: No obvious rash, lesion, or ulcer.    LABORATORY PANEL:   CBC  Recent Labs Lab 12/12/14 0210  WBC 11.6*  HGB 15.2  HCT 45.8  PLT 236   ------------------------------------------------------------------------------------------------------------------  Chemistries   Recent Labs Lab 12/12/14 0210  NA 138  K 4.3  CL 102  CO2 28  GLUCOSE 120*  BUN 18  CREATININE 0.75  CALCIUM 9.1  AST 28  ALT 29  ALKPHOS 60  BILITOT 0.5   ------------------------------------------------------------------------------------------------------------------  Cardiac Enzymes  Recent Labs Lab 12/12/14 0210  TROPONINI <0.03   ------------------------------------------------------------------------------------------------------------------  RADIOLOGY:  US Abdomen Limited Ruq  12/12/2014   CLINICAL DATA:  Epigastric pain for 6 hours.  EXAM: US ABDOMEN LIMITED - RIGHT UPPER QUADRANT  COMPARISON:  None.  FINDINGS: Gallbladder:  Non mobile gallstone in the neck measures 1.5 cm. No wall thickening, wall thickness of 2.4 mm. No sonographic Eulah Pont sign noted, however patient has been administered pain medication.  Common bile duct:  Diameter: 4 mm  Liver:  No focal lesion identified. Diffusely increased and heterogeneous in parenchymal echogenicity.  Technically limited and difficult exam due to large body habitus.  IMPRESSION: 1. Non mobile gallstone in the neck. No wall thickening. Negative sonographic Murphy sign, however limited assessment given administration of pain medication. No biliary dilatation. Nuclear medicine HIDA scan could be considered for further evaluation if there is clinical concern for acute cholecystitis. 2. Hepatic steatosis.  Electronically Signed   By: Rubye Oaks M.D.   On: 12/12/2014 05:22    EKG:   Orders placed or performed during the hospital encounter of 12/12/14  . ED EKG  . ED EKG  . EKG 12-Lead  . EKG 12-Lead    ASSESSMENT AND PLAN:   Cholelithiasis.  INR 2.1 today.  get laparoscopic cholecystectomy if INR is less than 1.6 per Dr. Juliann Pulse.  H/O PE.  Hold coumadin, continue heparin drip. F/u PTT, INR, D/c heparin drip before surgery.   All the records are reviewed and case discussed with Care Management/Social Workerr. Management plans discussed with the patient, family and they are in agreement.  CODE STATUS: Full code.  TOTAL TIME TAKING CARE OF THIS PATIENT: 33 minutes.   POSSIBLE D/C IN 3 DAYS, DEPENDING ON CLINICAL CONDITION.   Shaune Pollack M.D on 12/13/2014 at 12:06 PM  Between 7am to 6pm - Pager - [phone]  After 6pm go to [email or web address] - password [email or web address] [facility]  [facility]  Office  [phone]  CC: Primary care physician; Rayetta Humphrey, MD

## 2014-12-14 LAB — CBC
HCT: 41.9 % (ref 40.0–52.0)
Hemoglobin: 13.9 g/dL (ref 13.0–18.0)
MCH: 27.8 pg (ref 26.0–34.0)
MCHC: 33.1 g/dL (ref 32.0–36.0)
MCV: 83.9 fL (ref 80.0–100.0)
Platelets: 197 10*3/uL (ref 150–440)
RBC: 4.99 MIL/uL (ref 4.40–5.90)
RDW: 14.4 % (ref 11.5–14.5)
WBC: 9.7 10*3/uL (ref 3.8–10.6)

## 2014-12-14 LAB — PROTIME-INR
INR: 1.43
PROTHROMBIN TIME: 17.6 s — AB (ref 11.4–15.0)

## 2014-12-14 LAB — HEPARIN LEVEL (UNFRACTIONATED): Heparin Unfractionated: 0.45 IU/mL (ref 0.30–0.70)

## 2014-12-14 MED ORDER — DEXTROSE 5 % IV SOLN
1.0000 g | Freq: Once | INTRAVENOUS | Status: DC
  Filled 2014-12-14: qty 1

## 2014-12-14 MED ORDER — DEXTROSE 5 % IV SOLN
1.0000 g | Freq: Once | INTRAVENOUS | Status: AC
  Administered 2014-12-15: 1 g via INTRAVENOUS
  Filled 2014-12-14: qty 1

## 2014-12-14 MED ORDER — HEPARIN (PORCINE) IN NACL 100-0.45 UNIT/ML-% IJ SOLN: 2000.0000 [IU]/h | INTRAMUSCULAR | Status: AC

## 2014-12-14 NOTE — Progress Notes (Signed)
Patient ID: Russell Walters, male   DOB: 01/07/1966, 49 y.o.   MRN: [id number]  Surgery  The patient's INR has now 1.7.  The patient denies any abdominal pain. He has been nothing by mouth after midnight.   Filed Vitals:   12/14/14 0020 12/14/14 0519 12/14/14 0631 12/14/14 0743  BP: 133/74 127/73  132/82  Pulse: 72 64  69  Temp: 97.8 F (36.6 C) 97.8 F (36.6 C)  97.9 F (36.6 C)  TempSrc: Oral Oral  Oral  Resp: Height:      Weight:   357 lb 14.4 oz (162.342 kg)   SpO2: 93% 94%  98%    PE: Abdomen is soft and nontender. No RUQ tenderness is noted.  Labs  CBC Latest Ref Rng 12/14/2014 12/12/2014  WBC 3.8 - 10.6 K/uL 9.7 11.6(H)  Hemoglobin 13.0 - 18.0 g/dL 95.6 21.3  Hematocrit 08.6 - 52.0 % 41.9 45.8  Platelets 150 - 440 K/uL 197 236   CMP Latest Ref Rng 12/12/2014  Glucose 65 - 99 mg/dL 578(I)  BUN 6 - 20 mg/dL 18  Creatinine 6.96 - 2.95 mg/dL 2.84  Sodium 132 - 440 mmol/L 138  Potassium 3.5 - 5.1 mmol/L 4.3  Chloride 101 - 111 mmol/L 102  CO2 22 - 32 mmol/L 28  Calcium 8.9 - 10.3 mg/dL 9.1  Total Protein 6.5 - 8.1 g/dL 7.3  Total Bilirubin 0.3 - 1.2 mg/dL 0.5  Alkaline Phos 38 - 126 U/L 60  AST 15 - 41 U/L 28  ALT 17 - 63 U/L 29     IMP cholecystitis in a patient with a prior DVT on Coumadin.  Plan:  Now that his INR is corrected I will plan for laparoscopic cholecystectomy today. I discussed with him the risks of surgery including that of bleeding infection need for conversion open operation 1 and 200 chance of bile duct injury. All of his questions were answered. Family member was at the bedside.

## 2014-12-14 NOTE — Progress Notes (Signed)
[facility] Physicians -  at [facility]   PATIENT NAME: Russell Walters    MR#:  [id number]  DATE OF BIRTH:  Sep 13, 1965  SUBJECTIVE:  CHIEF COMPLAINT:   Chief Complaint  Patient presents with  . Abdominal Pain    Pt presents to ER alert and in NAD. Pt states epigastric pain that started this evening. Pt denies n/v/d. Resp even and unlabored.  No abd pain.  REVIEW OF SYSTEMS:  CONSTITUTIONAL: No fever, fatigue or weakness.  EYES: No blurred or double vision.  EARS, NOSE, AND THROAT: No tinnitus or ear pain.  RESPIRATORY: No cough, shortness of breath, wheezing or hemoptysis.  CARDIOVASCULAR: No chest pain, orthopnea, edema.  GASTROINTESTINAL: No nausea, vomiting, diarrhea, no abdominal pain.  GENITOURINARY: No dysuria, hematuria.  ENDOCRINE: No polyuria, nocturia,  HEMATOLOGY: No anemia, easy bruising or bleeding SKIN: No rash or lesion. MUSCULOSKELETAL: No joint pain or arthritis.   NEUROLOGIC: No tingling, numbness, weakness.  PSYCHIATRY: No anxiety or depression.   DRUG ALLERGIES:  No Known Allergies  VITALS:  Blood pressure 132/82, pulse 69, temperature 97.9 F (36.6 C), temperature source Oral, resp. rate 18, height  (1.854 m), weight 162.342 kg (357 lb 14.4 oz), SpO2 98 %.  PHYSICAL EXAMINATION:  GENERAL:  49 y.o.-year-old patient lying in the bed with no acute distress. obese. EYES: Pupils equal, round, reactive to light and accommodation. No scleral icterus. Extraocular muscles intact.  HEENT: Head atraumatic, normocephalic. Oropharynx and nasopharynx clear.  NECK:  Supple, no jugular venous distention. No thyroid enlargement, no tenderness.  LUNGS: Normal breath sounds bilaterally, no wheezing, rales,rhonchi or crepitation. No use of accessory muscles of respiration.  CARDIOVASCULAR: S1, S2 normal. No murmurs, rubs, or gallops.  ABDOMEN: Soft, no tenderness, nondistended. Bowel sounds present. No organomegaly or mass.  EXTREMITIES: No  pedal edema, cyanosis, or clubbing.  NEUROLOGIC: Cranial nerves II through XII are intact. Muscle strength 5/5 in all extremities. Sensation intact. Gait not checked.  PSYCHIATRIC: The patient is alert and oriented x 3.  SKIN: No obvious rash, lesion, or ulcer.    LABORATORY PANEL:   CBC  Recent Labs Lab 12/14/14 0627  WBC 9.7  HGB 13.9  HCT 41.9  PLT 197   ------------------------------------------------------------------------------------------------------------------  Chemistries   Recent Labs Lab 12/12/14 0210  NA 138  K 4.3  CL 102  CO2 28  GLUCOSE 120*  BUN 18  CREATININE 0.75  CALCIUM 9.1  AST 28  ALT 29  ALKPHOS 60  BILITOT 0.5   ------------------------------------------------------------------------------------------------------------------  Cardiac Enzymes  Recent Labs Lab 12/12/14 0210  TROPONINI <0.03   ------------------------------------------------------------------------------------------------------------------  RADIOLOGY:  No results found.  EKG:   Orders placed or performed during the hospital encounter of 12/12/14  . ED EKG  . ED EKG  . EKG 12-Lead  . EKG 12-Lead    ASSESSMENT AND PLAN:   Cholelithiasis.  INR 1.43 today.  F/u Dr. Egbert Garibaldi for  laparoscopic cholecystectomy today.  H/O PE.  Hold coumadin, continue heparin drip. D/c heparin drip before surgery.   All the records are reviewed and case discussed with Care Management/Social Workerr. Management plans discussed with the patient, family and they are in agreement.  CODE STATUS: Full code.  TOTAL TIME TAKING CARE OF THIS PATIENT: 33 minutes.   POSSIBLE D/C IN 3 DAYS, DEPENDING ON CLINICAL CONDITION.   Shaune Pollack M.D on 12/14/2014 at 12:43 PM  Between 7am to 6pm - Pager - [phone]  After 6pm go to [email or web address] - password [email or web address]  [facility]  Office  [phone]  CC: Primary care physician; Sharyne Peach, MD

## 2014-12-14 NOTE — Progress Notes (Addendum)
ANTICOAGULATION CONSULT NOTE - Initial Consult  Pharmacy Consult for Heparin  Indication: PE  No Known Allergies  Patient Measurements: Height:  (185.4 cm) Weight: (!) 357 lb 14.4 oz (162.342 kg) IBW/kg (Calculated) : 79.9 Heparin Dosing Weight: 118.8 kg  Vital Signs: Temp: 97.9 F (36.6 C) (08/01 0743) Temp Source: Oral (08/01 0743) BP: 132/82 mmHg (08/01 0743) Pulse Rate: 69 (08/01 0743)  Labs:  Recent Labs  12/12/14 0210 12/12/14 1857 12/13/14 0057 12/13/14 0608 12/14/14 0627  HGB 15.2  --   --   --  13.9  HCT 45.8  --   --   --  41.9  PLT 236  --   --   --  197  LABPROT 25.3*  --   --  23.7* 17.6*  INR 2.28  --   --  2.10 1.43  HEPARINUNFRC  --  0.35 0.38  --  0.45  CREATININE 0.75  --   --   --   --   TROPONINI <0.03  --   --   --   --     Estimated Creatinine Clearance: 182.3 mL/min (by C-G formula based on Cr of 0.75).   Medical History: Past Medical History  Diagnosis Date  . Pulmonary embolism   . Vertigo     Medications:  Prescriptions prior to admission  Medication Sig Dispense Refill Last Dose  . WARFARIN SODIUM PO Take 4 mg by mouth every Friday.   12/11/2014 at 2300  . WARFARIN SODIUM PO Take 8 mg by mouth daily. Take every day except Friday   Past Week at Unknown time    Assessment: Pharmacy consulted to dose heparin for PE in this 49 year old male, patient on coumadin PTA. Holding coumadin for surgery with heparin in the interim.  Goal of Therapy:  Heparin level 0.3-0.7 units/ml Monitor platelets by anticoagulation protocol: Yes   Plan:  Current orders for heparin 2000 units/hr. Patient scheduled for surgery tomorrow morning at 07:30. Discussed with Dr. Egbert Garibaldi, will stop heparin at midnight tonight in order to proceed to surgery in AM.  (Of note - heparin was stopped for about 2 hours todayin anticipation of going to OR, however was not able to schedule. Heparin resumed at 13:45 today to run until midnight.)  Will check CBC AM  labs, will follow up with hospitalist/surgery in regards to resuming anticoagulation after surgery.   Garlon Hatchet, PharmD Clinical Pharmacist  12/14/2014,2:52 PM

## 2014-12-14 NOTE — Progress Notes (Signed)
Patient ID: Russell Walters, male   DOB: [birth year], 49 y.o.   MRN: [id number]  In speaking with pharmacy allotted amount of time to heparin drip being off the minimum of 6 hours. OR time is not available at that time frame. I discussed this with him and his fiance. As he is asymptomatic at this point we will put him on for first thing in the morning in the main operating room and I will discuss with internal medicine and pharmacy the timing of the discontinuation of the heparin drip.

## 2014-12-14 NOTE — Care Management (Signed)
Patient INR 1.43.  Per Dr. Imogene Burn plan for OR.  No anticipated CM needs at this time

## 2014-12-14 NOTE — Progress Notes (Signed)
ANTICOAGULATION CONSULT NOTE - Initial Consult  Pharmacy Consult for Heparin  Indication: DVT  No Known Allergies  Patient Measurements: Height:  (185.4 cm) Weight: (!) 357 lb 14.4 oz (162.342 kg) IBW/kg (Calculated) : 79.9 Heparin Dosing Weight: 118.8 kg  Vital Signs: Temp: 97.8 F (36.6 C) (08/01 0519) Temp Source: Oral (08/01 0519) BP: 127/73 mmHg (08/01 0519) Pulse Rate: 64 (08/01 0519)  Labs:  Recent Labs  12/12/14 0210 12/12/14 1857 12/13/14 0057 12/13/14 0608 12/14/14 0627  HGB 15.2  --   --   --  13.9  HCT 45.8  --   --   --  41.9  PLT 236  --   --   --  197  LABPROT 25.3*  --   --  23.7* 17.6*  INR 2.28  --   --  2.10 1.43  HEPARINUNFRC  --  0.35 0.38  --  0.45  CREATININE 0.75  --   --   --   --   TROPONINI <0.03  --   --   --   --     Estimated Creatinine Clearance: 182.3 mL/min (by C-G formula based on Cr of 0.75).   Medical History: Past Medical History  Diagnosis Date  . Pulmonary embolism   . Vertigo     Medications:  Prescriptions prior to admission  Medication Sig Dispense Refill Last Dose  . WARFARIN SODIUM PO Take 4 mg by mouth every Friday.   12/11/2014 at 2300  . WARFARIN SODIUM PO Take 8 mg by mouth daily. Take every day except Friday   Past Week at Unknown time    Assessment: Pt with VTE  Goal of Therapy:  Heparin level 0.3-0.7 units/ml Monitor platelets by anticoagulation protocol: Yes   Plan:  Patient currently ordered Heparin drip at rate of 2000 units/hr.  HL at 0627 of 0.45.    Continue current orders and recheck anti-Xa level and CBC with AM labs.   Romey Mathieson G 12/14/2014,7:34 AM

## 2014-12-15 ENCOUNTER — Encounter
Admission: EM | Disposition: A | Discharge status: Home / Self Care | Payer: Self-pay | Source: Home / Self Care | Attending: Internal Medicine

## 2014-12-15 ENCOUNTER — Inpatient Hospital Stay: Payer: Managed Care, Other (non HMO) | Admitting: Anesthesiology

## 2014-12-15 ENCOUNTER — Encounter: Payer: Self-pay | Admitting: Anesthesiology

## 2014-12-15 DIAGNOSIS — K8020 Calculus of gallbladder without cholecystitis without obstruction: Secondary | ICD-10-CM | POA: Diagnosis present

## 2014-12-15 DIAGNOSIS — K8000 Calculus of gallbladder with acute cholecystitis without obstruction: Secondary | ICD-10-CM

## 2014-12-15 HISTORY — PX: CHOLECYSTECTOMY: SHX55

## 2014-12-15 LAB — CBC
HEMATOCRIT: 43.5 % (ref 40.0–52.0)
HEMOGLOBIN: 14.4 g/dL (ref 13.0–18.0)
MCH: 28.1 pg (ref 26.0–34.0)
MCHC: 33.1 g/dL (ref 32.0–36.0)
MCV: 84.9 fL (ref 80.0–100.0)
PLATELETS: 199 10*3/uL (ref 150–440)
RBC: 5.13 MIL/uL (ref 4.40–5.90)
RDW: 14.3 % (ref 11.5–14.5)
WBC: 9.1 10*3/uL (ref 3.8–10.6)

## 2014-12-15 LAB — SURGICAL PCR SCREEN
MRSA, PCR: NEGATIVE
STAPHYLOCOCCUS AUREUS: NEGATIVE

## 2014-12-15 SURGERY — LAPAROSCOPIC CHOLECYSTECTOMY
Anesthesia: General | Wound class: Clean Contaminated

## 2014-12-15 MED ORDER — ENOXAPARIN SODIUM 40 MG/0.4ML ~~LOC~~ SOLN: 40.0000 mg | SUBCUTANEOUS | Status: DC

## 2014-12-15 MED ORDER — SUCCINYLCHOLINE CHLORIDE 20 MG/ML IJ SOLN
INTRAMUSCULAR | Status: DC | PRN
  Administered 2014-12-15: 200 mg via INTRAVENOUS

## 2014-12-15 MED ORDER — LIDOCAINE HCL (CARDIAC) 20 MG/ML IV SOLN
INTRAVENOUS | Status: DC | PRN
  Administered 2014-12-15: 100 mg via INTRAVENOUS

## 2014-12-15 MED ORDER — OXYCODONE HCL 5 MG PO TABS: 5.0000 mg | ORAL_TABLET | Freq: Once | ORAL | Status: DC | PRN

## 2014-12-15 MED ORDER — FENTANYL CITRATE (PF) 100 MCG/2ML IJ SOLN
25.0000 ug | INTRAMUSCULAR | Status: DC | PRN
  Administered 2014-12-15 (×2): 50 ug via INTRAVENOUS

## 2014-12-15 MED ORDER — BUPIVACAINE HCL 0.25 % IJ SOLN
INTRAMUSCULAR | Status: DC | PRN
Start: 2014-12-15 — End: 2014-12-15
  Administered 2014-12-15: 20 mL

## 2014-12-15 MED ORDER — OXYCODONE HCL 5 MG/5ML PO SOLN: 5.0000 mg | Freq: Once | ORAL | Status: DC | PRN

## 2014-12-15 MED ORDER — HYDROCODONE-ACETAMINOPHEN 5-325 MG PO TABS: 1.0000 | ORAL_TABLET | Freq: Four times a day (QID) | ORAL | Status: DC | PRN

## 2014-12-15 MED ORDER — PROPOFOL 10 MG/ML IV BOLUS
INTRAVENOUS | Status: DC | PRN
  Administered 2014-12-15: 200 mg via INTRAVENOUS

## 2014-12-15 MED ORDER — ENOXAPARIN SODIUM 150 MG/ML ~~LOC~~ SOLN
1.0000 mg/kg | Freq: Two times a day (BID) | SUBCUTANEOUS | Status: DC
  Filled 2014-12-15: qty 2

## 2014-12-15 MED ORDER — ENOXAPARIN SODIUM 150 MG/ML ~~LOC~~ SOLN: 1.0000 mg/kg | Freq: Two times a day (BID) | SUBCUTANEOUS | Status: AC

## 2014-12-15 MED ORDER — KETOROLAC TROMETHAMINE 30 MG/ML IJ SOLN
INTRAMUSCULAR | Status: DC | PRN
  Administered 2014-12-15: 30 mg via INTRAVENOUS

## 2014-12-15 MED ORDER — MIDAZOLAM HCL 2 MG/2ML IJ SOLN
INTRAMUSCULAR | Status: DC | PRN
  Administered 2014-12-15: 2 mg via INTRAVENOUS

## 2014-12-15 MED ORDER — DEXAMETHASONE SODIUM PHOSPHATE 4 MG/ML IJ SOLN
INTRAMUSCULAR | Status: DC | PRN
  Administered 2014-12-15: 10 mg via INTRAVENOUS

## 2014-12-15 MED ORDER — SUGAMMADEX SODIUM 200 MG/2ML IV SOLN
INTRAVENOUS | Status: DC | PRN
  Administered 2014-12-15: 323.6 mg via INTRAVENOUS

## 2014-12-15 MED ORDER — SODIUM CHLORIDE 0.9 % IR SOLN
Status: DC | PRN
  Administered 2014-12-15: 400 mL

## 2014-12-15 MED ORDER — LACTATED RINGERS IV SOLN
INTRAVENOUS | Status: DC | PRN
  Administered 2014-12-15: 07:00:00 via INTRAVENOUS

## 2014-12-15 MED ORDER — ROCURONIUM BROMIDE 100 MG/10ML IV SOLN
INTRAVENOUS | Status: DC | PRN
  Administered 2014-12-15: 50 mg via INTRAVENOUS

## 2014-12-15 MED ORDER — FENTANYL CITRATE (PF) 100 MCG/2ML IJ SOLN
INTRAMUSCULAR | Status: DC | PRN
  Administered 2014-12-15 (×2): 50 ug via INTRAVENOUS
  Administered 2014-12-15: 150 ug via INTRAVENOUS

## 2014-12-15 SURGICAL SUPPLY — 54 items
APPLIER CLIP 5 13 M/L LIGAMAX5 (MISCELLANEOUS) ×3
BAG COUNTER SPONGE EZ (MISCELLANEOUS) IMPLANT
BLADE CLIPPER SURG (BLADE) ×3 IMPLANT
BULB RESERV EVAC DRAIN JP 100C (MISCELLANEOUS) IMPLANT
CANISTER SUCT 1200ML W/VALVE (MISCELLANEOUS) ×3 IMPLANT
CATH CHOLANG 76X19 KUMAR (CATHETERS) IMPLANT
CHLORAPREP W/TINT 26ML (MISCELLANEOUS) ×3 IMPLANT
CLIP APPLIE 5 13 M/L LIGAMAX5 (MISCELLANEOUS) ×1 IMPLANT
CLOSURE WOUND 1/2 X4 (GAUZE/BANDAGES/DRESSINGS) ×1
COUNTER SPONGE BAG EZ (MISCELLANEOUS)
DEFOGGER SCOPE WARMER CLEARIFY (MISCELLANEOUS) ×3 IMPLANT
DISSECTOR KITTNER STICK (MISCELLANEOUS) ×2 IMPLANT
DISSECTORS/KITTNER STICK (MISCELLANEOUS) ×6
DRAIN CHANNEL JP 19F (MISCELLANEOUS) IMPLANT
DRAPE SHEET LG 3/4 BI-LAMINATE (DRAPES) ×3 IMPLANT
DRAPE UTILITY 15X26 TOWEL STRL (DRAPES) ×6 IMPLANT
DRSG TEGADERM 2-3/8X2-3/4 SM (GAUZE/BANDAGES/DRESSINGS) ×12 IMPLANT
DRSG TELFA 3X8 NADH (GAUZE/BANDAGES/DRESSINGS) ×3 IMPLANT
ENDOPOUCH RETRIEVER 10 (MISCELLANEOUS) ×3 IMPLANT
GLOVE BIO SURGEON STRL SZ7.5 (GLOVE) ×9 IMPLANT
GLOVE BIOGEL PI IND STRL 7.0 (GLOVE) ×2 IMPLANT
GLOVE BIOGEL PI INDICATOR 7.0 (GLOVE) ×4
GOWN STRL REUS W/ TWL LRG LVL3 (GOWN DISPOSABLE) ×2 IMPLANT
GOWN STRL REUS W/ TWL XL LVL3 (GOWN DISPOSABLE) ×1 IMPLANT
GOWN STRL REUS W/TWL LRG LVL3 (GOWN DISPOSABLE) ×4
GOWN STRL REUS W/TWL XL LVL3 (GOWN DISPOSABLE) ×2
IRRIGATION STRYKERFLOW (MISCELLANEOUS) ×1 IMPLANT
IRRIGATOR STRYKERFLOW (MISCELLANEOUS) ×3
IV NS 1000ML (IV SOLUTION) ×2
IV NS 1000ML BAXH (IV SOLUTION) ×1 IMPLANT
LABEL OR SOLS (LABEL) ×3 IMPLANT
NEEDLE 18GX1X1/2 (RX/OR ONLY) (NEEDLE) IMPLANT
NEEDLE HYPO 25X1 1.5 SAFETY (NEEDLE) ×3 IMPLANT
NS IRRIG 500ML POUR BTL (IV SOLUTION) ×3 IMPLANT
PACK LAP CHOLECYSTECTOMY (MISCELLANEOUS) ×3 IMPLANT
PAD GROUND ADULT SPLIT (MISCELLANEOUS) ×3 IMPLANT
PAD TELFA 2X3 NADH STRL (GAUZE/BANDAGES/DRESSINGS) ×3 IMPLANT
SCISSORS METZENBAUM CVD 33 (INSTRUMENTS) ×3 IMPLANT
SLEEVE ADV FIXATION 5X100MM (TROCAR) ×9 IMPLANT
STRAP SAFETY BODY (MISCELLANEOUS) ×3 IMPLANT
STRIP CLOSURE SKIN 1/2X4 (GAUZE/BANDAGES/DRESSINGS) ×2 IMPLANT
SUT ETHILON 3-0 FS-10 30 BLK (SUTURE)
SUT ETHILON 4-0 (SUTURE) ×2
SUT ETHILON 4-0 FS2 18XMFL BLK (SUTURE) ×1
SUT VIC AB 0 UR5 27 (SUTURE) ×6 IMPLANT
SUT VIC AB 4-0 FS2 27 (SUTURE) ×6 IMPLANT
SUT VICRYL 0 AB UR-6 (SUTURE) ×3 IMPLANT
SUTURE EHLN 3-0 FS-10 30 BLK (SUTURE) IMPLANT
SUTURE ETHLN 4-0 FS2 18XMF BLK (SUTURE) ×1 IMPLANT
SWABSTK COMLB BENZOIN TINCTURE (MISCELLANEOUS) ×3 IMPLANT
SYR 3ML LL SCALE MARK (SYRINGE) IMPLANT
TROCAR XCEL BLUNT TIP 100MML (ENDOMECHANICALS) ×3 IMPLANT
TROCAR Z-THREAD OPTICAL 5X100M (TROCAR) ×3 IMPLANT
TUBING INSUFFLATOR HI FLOW (MISCELLANEOUS) ×3 IMPLANT

## 2014-12-15 NOTE — Anesthesia Procedure Notes (Signed)
Procedure Name: Intubation Date/Time: 12/15/2014 7:40 AM Performed by: Michaele Offer Pre-anesthesia Checklist: Patient identified, Emergency Drugs available, Suction available, Patient being monitored and Timeout performed Patient Re-evaluated:Patient Re-evaluated prior to inductionOxygen Delivery Method: Circle system utilized Preoxygenation: Pre-oxygenation with 100% oxygen Intubation Type: IV induction Ventilation: Two handed mask ventilation required and Oral airway inserted - appropriate to patient size Laryngoscope Size: Mac and 4 Grade View: Grade II Tube type: Oral Tube size: 7.5 mm Number of attempts: 1 Airway Equipment and Method: Rigid stylet (patient ramped on blankets.) Placement Confirmation: ETT inserted through vocal cords under direct vision,  positive ETCO2 and breath sounds checked- equal and bilateral Secured at: 23 (at teeth) cm Tube secured with: Tape Dental Injury: Teeth and Oropharynx as per pre-operative assessment

## 2014-12-15 NOTE — Op Note (Signed)
12/12/2014 - 12/15/2014  8:43 AM  PATIENT:  Russell Walters  49 y.o. male  PRE-OPERATIVE DIAGNOSIS:  Cholecystitis   POST-OPERATIVE DIAGNOSIS:  Cholecystitis   PROCEDURE:  Procedure(s): LAPAROSCOPIC CHOLECYSTECTOMY (N/A)  SURGEON:  Surgeon(s) and Role:    * Natale Lay, MD - Primary  ASSISTANTS: none   ANESTHESIA: Gen.     SPECIMEN: Gallbladder and stone  EBL: Minimal  Description of procedure:    With informed consent supine position and general oral endotracheal anesthesia the patient's abdomen was clipped of hair sterilely prepped and draped with chlor prep solution and a time out was observed.   A 12 mm blunt Hassan trocar was placed through an open technique through an infraumbilical transversely oriented skin incision with stay sutures passed to the fascia. Pneumoperitoneum was established. The patient was in position reverse Trendelenburg and airplaned right side up.   Remaining trochar were placed under direct visualization with 5 mm bladeless trocar in the epigastric region and two 5 mm bladeless trochars in the right upper quadrant. The gallbladder was grasped along its fundus and elevated towards right shoulder. Lateral traction was applied to Hartman's pouch. The hepatoduodenal ligament was dissected utilizing blunt technique. The peritoneum was divided with hook electrocautery. A critical view of safety cholecystectomy was achieved. The cystic duct was triply clipped on the portal side singlely clipped on the gallbladder side and divided. The cystic artery was divided between an anterior and posterior branch with 2 clips placed on the portal side single clip on the gallbladder side and sharp scissors. Further dissection in this area demonstrated no evidence of an aberrant bile duct or artery. The gallbladder was then easily retrieved off the gallbladder fossa utilizing hook cautery apparatus placed into an Endo Catch device and retrieved. Triple process a 5 mm surgical  telescope was introduced into the epigastric region demonstrating no evidence of hernia at the umbilicus incision or injury of bowel. The peritoneum was then reestablished. The right upper quadrant was irrigated with couple 100 cc's of warm normal saline and aspirated dry and point hemostasis in the gallbladder fossa was obtained with electrocautery. Hemostasis been ensured on the operative field ports are then removed under direct visualization. A total of 20 cc of 0.25% plain Marcaine was infiltrated along all skin and fascial incisions prior to closure. A combination of 4-0 Vicryls and 4-0 nylon suture was used to reapproximate skin edges. Half-inch Steri-Strips Telfa and Tegaderm were then applied and the patient was then subsequently extubated and taken to the recovery room in stable and satisfactory condition by anesthesia services.

## 2014-12-15 NOTE — Progress Notes (Signed)
Date of Initial H&P: 12/12/2014.  History reviewed, patient examined, no change in status, stable for surgery.

## 2014-12-15 NOTE — Transfer of Care (Signed)
Immediate Anesthesia Transfer of Care Note  Patient: Russell Walters  Procedure(s) Performed: Procedure(s): LAPAROSCOPIC CHOLECYSTECTOMY (N/A)  Patient Location: PACU  Anesthesia Type:General  Level of Consciousness: awake, alert , oriented and patient cooperative  Airway & Oxygen Therapy: Patient Spontanous Breathing and Patient connected to face mask oxygen  Post-op Assessment: Report given to RN, Post -op Vital signs reviewed and stable and Patient moving all extremities X 4  Post vital signs: Reviewed and stable  Last Vitals:  Filed Vitals:   12/15/14 0856  BP: 121/86  Pulse: 77  Temp: 37.2 C  Resp: 12    Complications: No apparent anesthesia complications

## 2014-12-15 NOTE — Anesthesia Preprocedure Evaluation (Signed)
Anesthesia Evaluation  Patient identified by MRN, date of birth, ID band Patient awake    Reviewed: Allergy & Precautions, H&P , NPO status , Patient's Chart, lab work & pertinent test results, reviewed documented beta blocker date and time   History of Anesthesia Complications Negative for: history of anesthetic complications  Airway Mallampati: III  TM Distance: >3 FB Neck ROM: full    Dental no notable dental hx. (+) Teeth Intact   Pulmonary neg pulmonary ROS,  breath sounds clear to auscultation  Pulmonary exam normal       Cardiovascular Exercise Tolerance: Poor Normal cardiovascular examRhythm:regular Rate:Normal     Neuro/Psych negative neurological ROS  negative psych ROS   GI/Hepatic negative GI ROS, Neg liver ROS,   Endo/Other  Morbid obesity  Renal/GU negative Renal ROS  negative genitourinary   Musculoskeletal   Abdominal   Peds  Hematology negative hematology ROS (+)   Anesthesia Other Findings Past Medical History:   Pulmonary embolism                                           Vertigo                                                      Reproductive/Obstetrics negative OB ROS                             Anesthesia Physical Anesthesia Plan  ASA: III  Anesthesia Plan: General ETT   Post-op Pain Management:    Induction:   Airway Management Planned:   Additional Equipment:   Intra-op Plan:   Post-operative Plan:   Informed Consent: I have reviewed the patients History and Physical, chart, labs and discussed the procedure including the risks, benefits and alternatives for the proposed anesthesia with the patient or authorized representative who has indicated his/her understanding and acceptance.   Dental Advisory Given  Plan Discussed with: Anesthesiologist, CRNA and Surgeon  Anesthesia Plan Comments:         Anesthesia Quick Evaluation

## 2014-12-15 NOTE — Progress Notes (Signed)
ANTICOAGULATION CONSULT NOTE  Pharmacy Consult for Enoxaparin Indication: VTE  No Known Allergies  Patient Measurements: Height:  (185.4 cm) Weight: (!) 356 lb 9.6 oz (161.753 kg) IBW/kg (Calculated) : 79.9  Vital Signs: Temp: 97.8 F (36.6 C) (08/02 1048) Temp Source: Oral (08/02 1048) BP: 110/53 mmHg (08/02 1048) Pulse Rate: 60 (08/02 1048)  Labs:  Recent Labs  12/12/14 1857 12/13/14 0057 12/13/14 0608 12/14/14 0627 12/15/14 0440  HGB  --   --   --  13.9 14.4  HCT  --   --   --  41.9 43.5  PLT  --   --   --  197 199  LABPROT  --   --  23.7* 17.6*  --   INR  --   --  2.10 1.43  --   HEPARINUNFRC 0.35 0.38  --  0.45  --     Estimated Creatinine Clearance: 182 mL/min (by C-G formula based on Cr of 0.75).   Medications:  Scheduled:  . docusate sodium  100 mg Oral BID  . [START ON 12/16/2014] enoxaparin (LOVENOX) injection  1 mg/kg Subcutaneous Q12H  . enoxaparin (LOVENOX) injection  40 mg Subcutaneous Q24H    Assessment: Pharmacy consulted to dose enoxaparin as bridge to warfarin in this 49 year old male. Patient on warfarin for PE PTA, transitioned to heparin for surgery. Now post surgical, will resume anticoagulation 24 hours after surgery with enoxaparin bridging to warfarin.   MD desires dosing recommendations for discharge.   Plan:  Per surgery, would like prophylactic lovenox today, delay full anticoagulation for 24 hours. Ordered enoxaparin  SQ x 1 this evening.   Will plan to start enoxaparin /kg SQ Q12H tomorrow AM and administer warfarin tomorrow as well.  Patient can go home on enoxaparin  SQ Q12H (syringes of  not available) and resume home coumadin dosing.  Patient was taking coumadin  PO daily except  on Fridays, INR therapeutic on admission.  Patient should continue enoxaparin and warfarin and follow up with INR clinic in 3 days for further recommendations.   Garlon Hatchet, PharmD Clinical Pharmacist   12/15/2014,11:28 AM

## 2014-12-15 NOTE — Progress Notes (Addendum)
   I would not initiate any therapeutic heparin administration for 24 hours following surgery. Subcutaneous prophylactic dose Lovenox or heparin is suitable.  She needs to see me back on August 11 for suture removal in my [place] office.

## 2014-12-15 NOTE — Discharge Summary (Signed)
[facility] - [place] at [facility]   PATIENT NAME: Russell Walters    MR#:  [id number]  DATE OF BIRTH:  07/05/1965  DATE OF ADMISSION:  12/12/2014 ADMITTING PHYSICIAN: Arnaldo Natal, MD  DATE OF DISCHARGE: 12/15/2014  1:37 PM  PRIMARY CARE PHYSICIAN: Rayetta Humphrey, MD    ADMISSION DIAGNOSIS:  Choledocholithiasis [K80.50] Epigastric pain [R10.13]   DISCHARGE DIAGNOSIS:   cholelithiasis and cholecystitis  SECONDARY DIAGNOSIS:   Past Medical History  Diagnosis Date  . Pulmonary embolism   . Vertigo     HOSPITAL COURSE:   Cholelithiasis and  cholecystitis  The patient got laparoscopic cholecystectomy today. After holding Coumadin with decreased INR 1.43 today.According to Dr. Colette Ribas, the patient may be discharged home today but hold anticoagulation for 24 hours, start Lovenox and Coumadin tomorrow.  H/O PE. We hold coumadin and started heparin drip for surgery. heparin drip was discontinued before surgery. Dr. Colette Ribas, the patient will start Lovenox and Coumadin tomorrow 24 hours after surgery. The patient and need a follow-up INR with PCP and the discontinue Lovenox after INR becomes therapeutic.  I discussed with Dr. Colette Ribas. DISCHARGE CONDITIONS:   Stable. The patient was discharged to home today.  CONSULTS OBTAINED:  Treatment Team:  Ida Rogue, MD  DRUG ALLERGIES:  No Known Allergies  DISCHARGE MEDICATIONS:   Discharge Medication List as of 12/15/2014  1:08 PM    START taking these medications   Details  enoxaparin (LOVENOX) 150 MG/ML injection Inject 1.08 mLs (160 mg total) into the skin every 12 (twelve) hours., Starting 12/16/2014, Until Discontinued, Print    HYDROcodone-acetaminophen (NORCO/VICODIN) 5-325 MG per tablet Take 1 tablet by mouth every 6 (six) hours as needed for moderate pain., Starting 12/15/2014, Until Discontinued, Print      CONTINUE these medications which have NOT CHANGED   Details  WARFARIN SODIUM PO  Take 4 mg by mouth every Friday., Until Discontinued, Historical Med    WARFARIN SODIUM PO Take 8 mg by mouth daily. Take every day except Friday, Until Discontinued, Historical Med         DISCHARGE INSTRUCTIONS:    If you experience worsening of your admission symptoms, develop shortness of breath, life threatening emergency, suicidal or homicidal thoughts you must seek medical attention immediately by calling 911 or calling your MD immediately  if symptoms less severe.  You Must read complete instructions/literature along with all the possible adverse reactions/side effects for all the Medicines you take and that have been prescribed to you. Take any new Medicines after you have completely understood and accept all the possible adverse reactions/side effects.   Please note  You were cared for by a hospitalist during your hospital stay. If you have any questions about your discharge medications or the care you received while you were in the hospital after you are discharged, you can call the unit and asked to speak with the hospitalist on call if the hospitalist that took care of you is not available. Once you are discharged, your primary care physician will handle any further medical issues. Please note that NO REFILLS for any discharge medications will be authorized once you are discharged, as it is imperative that you return to your primary care physician (or establish a relationship with a primary care physician if you do not have one) for your aftercare needs so that they can reassess your need for medications and monitor your lab values.    Today   SUBJECTIVE   Abdominal pain  due to surgery.   VITAL SIGNS:  Blood pressure 121/65, pulse 70, temperature 97.9 F (36.6 C), temperature source Oral, resp. rate 20, height 6\' 1"  (1.854 m), weight 161.753 kg (356 lb 9.6 oz), SpO2 96 %.  I/O:   Intake/Output Summary (Last 24 hours) at 12/15/14 1541 Last data filed at 12/15/14 1200   Gross per 24 hour  Intake 3542.23 ml  Output   2610 ml  Net 932.23 ml    PHYSICAL EXAMINATION:  GENERAL:  49 y.o.-year-old patient lying in the bed with no acute distress. Obese  EYES: Pupils equal, round, reactive to light and accommodation. No scleral icterus. Extraocular muscles intact.  HEENT: Head atraumatic, normocephalic. Oropharynx and nasopharynx clear.  NECK:  Supple, no jugular venous distention. No thyroid enlargement, no tenderness.  LUNGS: Normal breath sounds bilaterally, no wheezing, rales,rhonchi or crepitation. No use of accessory muscles of respiration.  CARDIOVASCULAR: S1, S2 normal. No murmurs, rubs, or gallops.  ABDOMEN: Soft, mild tenderness, non-distended. Bowel sounds present. No organomegaly or mass.  EXTREMITIES: No pedal edema, cyanosis, or clubbing.  NEUROLOGIC: Cranial nerves II through XII are intact. Muscle strength 5/5 in all extremities. Sensation intact. Gait not checked.  PSYCHIATRIC: The patient is alert and oriented x 3.  SKIN: No obvious rash, lesion, or ulcer.   DATA REVIEW:   CBC  Recent Labs Lab 12/15/14 0440  WBC 9.1  HGB 14.4  HCT 43.5  PLT 199    Chemistries   Recent Labs Lab 12/12/14 0210  NA 138  K 4.3  CL 102  CO2 28  GLUCOSE 120*  BUN 18  CREATININE 0.75  CALCIUM 9.1  AST 28  ALT 29  ALKPHOS 60  BILITOT 0.5    Cardiac Enzymes  Recent Labs Lab 12/12/14 0210  TROPONINI <0.03    Microbiology Results  Results for orders placed or performed during the hospital encounter of 12/12/14  Surgical pcr screen     Status: None   Collection Time: 12/14/14 10:35 PM  Result Value Ref Range Status   MRSA, PCR NEGATIVE NEGATIVE Final   Staphylococcus aureus NEGATIVE NEGATIVE Final    Comment:        The Xpert SA Assay (FDA approved for NASAL specimens in patients over 57 years of age), is one component of a comprehensive surveillance program.  Test performance has been validated by [facility] for patients  greater than or equal to 67 year old. It is not intended to diagnose infection nor to guide or monitor treatment.     RADIOLOGY:  No results found.      Management plans discussed with the patient, family and they are in agreement.  CODE STATUS:     Code Status Orders        Start     Ordered   12/12/14 0859  Full code   Continuous     12/12/14 0859      TOTAL TIME TAKING CARE OF THIS PATIENT: 35 minutes.    Shaune Pollack M.D on 12/15/2014 at 3:41 PM  Between 7am to 6pm - Pager - [phone]  After 6pm go to [email or web address] - password [email or web address] [facility]  [facility]  Office  [phone]  CC: Primary care physician; Rayetta Humphrey, MD

## 2014-12-15 NOTE — Progress Notes (Signed)
Patient ID: Russell Walters, male   DOB: 23-Apr-1966, 49 y.o.   MRN: [id number]   Patient is with adequate pain control. Vital signs remain stable. Abdomen is soft and nontender. I discussed with him and his fiance the operative findings. His plan is for discharge home with subcutaneous Lovenox and transitioned to Coumadin as an outpatient.

## 2014-12-15 NOTE — Discharge Instructions (Signed)
Low fat diet. Activity as tolerated. Start lovenox and coumadin tomorrow. Follow up INR with PCP, discontinue lovenox when INR is therapeutic.  Laparoscopic Cholecystectomy Laparoscopic cholecystectomy is surgery to remove the gallbladder. The gallbladder is located in the upper right part of the abdomen, behind the liver. It is a storage sac for bile produced in the liver. Bile aids in the digestion and absorption of fats. Cholecystectomy is often done for inflammation of the gallbladder (cholecystitis). This condition is usually caused by a buildup of gallstones (cholelithiasis) in your gallbladder. Gallstones can block the flow of bile, resulting in inflammation and pain. In severe cases, emergency surgery may be required. When emergency surgery is not required, you will have time to prepare for the procedure. Laparoscopic surgery is an alternative to open surgery. Laparoscopic surgery has a shorter recovery time. Your common bile duct may also need to be examined during the procedure. If stones are found in the common bile duct, they may be removed. LET [facility] CARE PROVIDER KNOW ABOUT:  Any allergies you have.  All medicines you are taking, including vitamins, herbs, eye drops, creams, and over-the-counter medicines.  Previous problems you or members of your family have had with the use of anesthetics.  Any blood disorders you have.  Previous surgeries you have had.  Medical conditions you have. RISKS AND COMPLICATIONS Generally, this is a safe procedure. However, as with any procedure, complications can occur. Possible complications include:  Infection.  Damage to the common bile duct, nerves, arteries, veins, or other internal organs such as the stomach, liver, or intestines.  Bleeding.  A stone may remain in the common bile duct.  A bile leak from the cyst duct that is clipped when your gallbladder is removed.  The need to convert to open surgery, which requires a larger  incision in the abdomen. This may be necessary if your surgeon thinks it is not safe to continue with a laparoscopic procedure. BEFORE THE PROCEDURE  Ask your health care provider about changing or stopping any regular medicines. You will need to stop taking aspirin or blood thinners at least 5 days prior to surgery.  Do not eat or drink anything after midnight the night before surgery.  Let your health care provider know if you develop a cold or other infectious problem before surgery. PROCEDURE   You will be given medicine to make you sleep through the procedure (general anesthetic). A breathing tube will be placed in your mouth.  When you are asleep, your surgeon will make several small cuts (incisions) in your abdomen.  A thin, lighted tube with a tiny camera on the end (laparoscope) is inserted through one of the small incisions. The camera on the laparoscope sends a picture to a TV screen in the operating room. This gives the surgeon a good view inside your abdomen.  A gas will be pumped into your abdomen. This expands your abdomen so that the surgeon has more room to perform the surgery.  Other tools needed for the procedure are inserted through the other incisions. The gallbladder is removed through one of the incisions.  After the removal of your gallbladder, the incisions will be closed with stitches, staples, or skin glue. AFTER THE PROCEDURE  You will be taken to a recovery area where your progress will be checked often.  You may be allowed to go home the same day if your pain is controlled and you can tolerate liquids. Document Released: 05/01/2005 Document Revised: 02/19/2013 Document Reviewed: 12/11/2012  ExitCare® Patient Information ©2015 [employer]. This information is not intended to replace advice given to you by your health care provider. Make sure you discuss any questions you have with your health care provider. ° °

## 2014-12-15 NOTE — Anesthesia Postprocedure Evaluation (Signed)
  Anesthesia Post-op Note  Patient: Russell Walters  Procedure(s) Performed: Procedure(s): LAPAROSCOPIC CHOLECYSTECTOMY (N/A)  Anesthesia type:General ETT  Patient location: PACU  Post pain: Pain level controlled  Post assessment: Post-op Vital signs reviewed, Patient's Cardiovascular Status Stable, Respiratory Function Stable, Patent Airway and No signs of Nausea or vomiting  Post vital signs: Reviewed and stable  Last Vitals:  Filed Vitals:   12/15/14 1155  BP: 121/65  Pulse: 70  Temp: 36.6 C  Resp: 20    Level of consciousness: awake, alert  and patient cooperative  Complications: No apparent anesthesia complications

## 2014-12-16 LAB — SURGICAL PATHOLOGY

## 2014-12-22 ENCOUNTER — Encounter: Payer: Self-pay | Admitting: Surgery

## 2014-12-22 ENCOUNTER — Ambulatory Visit (INDEPENDENT_AMBULATORY_CARE_PROVIDER_SITE_OTHER): Payer: Managed Care, Other (non HMO) | Admitting: Surgery

## 2014-12-22 VITALS — BP 152/101 | HR 72 | Temp 98.6°F | Ht 73.0 in | Wt 348.0 lb

## 2014-12-22 DIAGNOSIS — K8012 Calculus of gallbladder with acute and chronic cholecystitis without obstruction: Secondary | ICD-10-CM

## 2014-12-22 DIAGNOSIS — K819 Cholecystitis, unspecified: Secondary | ICD-10-CM

## 2014-12-22 NOTE — Progress Notes (Signed)
Surgery clinic.   The patient is proximal a one-week status post laparoscopic cholecystectomy. He had acute on chronic cholecystitis. He is doing well. 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  He was discharged on postoperative day #1 with therapeutic subcutaneous Lovenox with transition to Coumadin. He is getting his pro-time checked today.  The patient denies any difficulty eating no nausea no vomiting he's had some loose stools. No jaundice no fever. Pain is well-controlled.  Physical examination demonstrates some minor ecchymosis around the epigastric port site as well as the umbilical port site. There is no wound drainage. His abdomen is soft and nontender and sclera are clear.  Pathology results were reviewed.  Impression doing very well approximately one week status post laparoscopic cholecystectomy.  Plan ProTime checked today under the care of his PCP. I will see the patient back as needed.

## 2014-12-22 NOTE — Patient Instructions (Signed)
Please see the disability note that you have been provided.   Please call with any questions or concerns.

## 2015-03-12 ENCOUNTER — Encounter: Payer: Self-pay | Admitting: *Deleted

## 2015-03-15 ENCOUNTER — Encounter: Payer: Self-pay | Admitting: *Deleted

## 2015-03-15 ENCOUNTER — Encounter
Admission: RE | Disposition: A | Discharge status: Home / Self Care | Payer: Self-pay | Source: Ambulatory Visit | Attending: Gastroenterology

## 2015-03-15 ENCOUNTER — Ambulatory Visit: Payer: Managed Care, Other (non HMO) | Admitting: *Deleted

## 2015-03-15 ENCOUNTER — Ambulatory Visit
Admission: RE | Admit: 2015-03-15 | Discharge: 2015-03-15 | Disposition: A | Discharge status: Home / Self Care | Payer: Managed Care, Other (non HMO) | Source: Ambulatory Visit | Attending: Gastroenterology | Admitting: Gastroenterology

## 2015-03-15 DIAGNOSIS — K760 Fatty (change of) liver, not elsewhere classified: Secondary | ICD-10-CM | POA: Insufficient documentation

## 2015-03-15 DIAGNOSIS — I10 Essential (primary) hypertension: Secondary | ICD-10-CM | POA: Diagnosis not present

## 2015-03-15 DIAGNOSIS — Z86718 Personal history of other venous thrombosis and embolism: Secondary | ICD-10-CM | POA: Insufficient documentation

## 2015-03-15 DIAGNOSIS — Z86711 Personal history of pulmonary embolism: Secondary | ICD-10-CM | POA: Diagnosis not present

## 2015-03-15 DIAGNOSIS — Z7901 Long term (current) use of anticoagulants: Secondary | ICD-10-CM | POA: Insufficient documentation

## 2015-03-15 DIAGNOSIS — K5730 Diverticulosis of large intestine without perforation or abscess without bleeding: Secondary | ICD-10-CM | POA: Insufficient documentation

## 2015-03-15 DIAGNOSIS — Z6841 Body Mass Index (BMI) 40.0 and over, adult: Secondary | ICD-10-CM | POA: Insufficient documentation

## 2015-03-15 DIAGNOSIS — R197 Diarrhea, unspecified: Secondary | ICD-10-CM | POA: Diagnosis present

## 2015-03-15 HISTORY — PX: COLONOSCOPY WITH PROPOFOL: SHX5780

## 2015-03-15 HISTORY — DX: Other specified symptoms and signs involving the circulatory and respiratory systems: R09.89

## 2015-03-15 HISTORY — DX: Headache, unspecified: R51.9

## 2015-03-15 HISTORY — DX: Essential (primary) hypertension: I10

## 2015-03-15 HISTORY — DX: Noninfective gastroenteritis and colitis, unspecified: K52.9

## 2015-03-15 HISTORY — DX: Cyst of kidney, acquired: N28.1

## 2015-03-15 HISTORY — DX: Headache: R51

## 2015-03-15 HISTORY — DX: Diverticulosis of intestine, part unspecified, without perforation or abscess without bleeding: K57.90

## 2015-03-15 HISTORY — DX: Acute embolism and thrombosis of unspecified deep veins of unspecified lower extremity: I82.409

## 2015-03-15 HISTORY — DX: Fatty (change of) liver, not elsewhere classified: K76.0

## 2015-03-15 HISTORY — DX: Obesity, unspecified: E66.9

## 2015-03-15 HISTORY — DX: Calcaneal spur, unspecified foot: M77.30

## 2015-03-15 HISTORY — DX: Left lower quadrant abdominal swelling, mass and lump: R19.04

## 2015-03-15 SURGERY — COLONOSCOPY WITH PROPOFOL
Anesthesia: General

## 2015-03-15 MED ORDER — PROPOFOL 500 MG/50ML IV EMUL
INTRAVENOUS | Status: DC | PRN
  Administered 2015-03-15: 140 ug/kg/min via INTRAVENOUS

## 2015-03-15 MED ORDER — SODIUM CHLORIDE 0.9 % IV SOLN
INTRAVENOUS | Status: DC
  Administered 2015-03-15: 1000 mL via INTRAVENOUS

## 2015-03-15 MED ORDER — PROPOFOL 10 MG/ML IV BOLUS
INTRAVENOUS | Status: DC | PRN
  Administered 2015-03-15: 50 mg via INTRAVENOUS

## 2015-03-15 MED ORDER — LIDOCAINE HCL (CARDIAC) 20 MG/ML IV SOLN
INTRAVENOUS | Status: DC | PRN
  Administered 2015-03-15: 60 mg via INTRAVENOUS

## 2015-03-15 MED ORDER — MIDAZOLAM HCL 2 MG/2ML IJ SOLN
INTRAMUSCULAR | Status: DC | PRN
  Administered 2015-03-15: 1 mg via INTRAVENOUS

## 2015-03-15 NOTE — Discharge Instructions (Signed)
Restart coumadin tonight. ° °YOU HAD AN ENDOSCOPIC PROCEDURE TODAY: Refer to the procedure report that was given to you for any specific questions about what was found during the examination.  If the procedure report does not answer your questions, please call your gastroenterologist to clarify. ° °YOU SHOULD EXPECT: Some feelings of bloating in the abdomen. Passage of more gas than usual.  Walking can help get rid of the air that was put into your GI tract during the procedure and reduce the bloating. If you had a lower endoscopy (such as a colonoscopy or flexible sigmoidoscopy) you may notice spotting of blood in your stool or on the toilet paper.  ° °DIET: Your first meal following the procedure should be a light meal and then it is ok to progress to your normal diet.  A half-sandwich or bowl of soup is an example of a good first meal.  Heavy or fried foods are harder to digest and may make you feel nasueas or bloated.  Drink plenty of fluids but you should avoid alcoholic beverages for 24 hours. ° °ACTIVITY: Your care partner should take you home directly after the procedure.  You should plan to take it easy, moving slowly for the rest of the day.  You can resume normal activity the day after the procedure however you should NOT DRIVE or use heavy machinery for 24 hours (because of the sedation medicines used during the test).   ° °SYMPTOMS TO REPORT IMMEDIATELY  °A gastroenterologist can be reached at any hour.  Please call your doctor's office for any of the following symptoms: ° °· Following lower endoscopy (colonoscopy, flexible sigmoidoscopy) ° Excessive amounts of blood in the stool ° Significant tenderness, worsening of abdominal pains ° Swelling of the abdomen that is new, acute ° Fever of 100° or higher °· Following upper endoscopy (EGD, EUS, ERCP) ° Vomiting of blood or coffee ground material ° New, significant abdominal pain ° New, significant chest pain or pain under the shoulder blades ° Painful or  persistently difficult swallowing ° New shortness of breath ° Black, tarry-looking stools ° °FOLLOW UP: °If any biopsies were taken you will be contacted by phone or by letter within the next 1-3 weeks.  Call your gastroenterologist if you have not heard about the biopsies in 3 weeks.  °Please also call your gastroenterologist's office with any specific questions about appointments or follow up tests. °  °

## 2015-03-15 NOTE — H&P (Signed)
  Primary Care Physician:  Rayetta HumphreyGeorge, Sionne A, MD  Pre-Procedure History & Physical: HPI:  Russell Walters is a 49 y.o. male is here for an colonoscopy.   Past Medical History  Diagnosis Date  . Pulmonary embolism (HCC)   . Vertigo   . DVT (deep venous thrombosis) (HCC)   . Hypertension   . Headache   . Sinus complaint   . Nonalcoholic fatty liver disease   . Heel spur   . Obesity   . LLQ abdominal mass   . Colitis   . Diverticulosis   . Renal cyst     Past Surgical History  Procedure Laterality Date  . Breast duct removal Right   . Cholecystectomy N/A 12/15/2014    Procedure: LAPAROSCOPIC CHOLECYSTECTOMY;  Surgeon: Natale LayMark Bird, MD;  Location: [facility] ORS;  Service: General;  Laterality: N/A;  . Cyst removed      Prior to Admission medications   Medication Sig Start Date End Date Taking? Authorizing Provider  montelukast (SINGULAIR) 10 MG tablet Take 10 mg by mouth at bedtime.   Yes Historical Provider, MD  WARFARIN SODIUM PO Take 4 mg by mouth every Friday.   Yes Historical Provider, MD  enoxaparin (LOVENOX) 150 MG/ML injection Inject 1.08 mLs (160 mg total) into the skin every 12 (twelve) hours. 12/16/14   Shaune PollackQing Chen, MD  WARFARIN SODIUM PO Take 8 mg by mouth daily. Take every day except Friday    Historical Provider, MD    Allergies as of 02/24/2015  . (No Known Allergies)    Family History  Problem Relation Age of Onset  . Diabetes Mellitus II Maternal Grandmother     Social History   Social History  . Marital Status: Divorced    Spouse Name: N/A  . Number of Children: N/A  . Years of Education: N/A   Occupational History  . Not on file.   Social History Main Topics  . Smoking status: Never Smoker   . Smokeless tobacco: Never Used  . Alcohol Use: 0.0 oz/week    0 Standard drinks or equivalent per week     Comment: Occasional  . Drug Use: No  . Sexual Activity: Not on file   Other Topics Concern  . Not on file   Social History Narrative   Lives with  his wife     Physical Exam: BP 140/78 mmHg  Pulse 70  Temp(Src) 98 F (36.7 C) (Oral)  Resp 20  Ht 6\' 1"  (1.854 m)  Wt 159.666 kg (352 lb)  BMI 46.45 kg/m2  SpO2 96% General:   Alert,  pleasant and cooperative in NAD Head:  Normocephalic and atraumatic. Neck:  Supple; no masses or thyromegaly. Lungs:  Clear throughout to auscultation.    Heart:  Regular rate and rhythm. Abdomen:  Soft, nontender and nondistended. Normal bowel sounds, without guarding, and without rebound, + obese abd  Neurologic:  Alert and  oriented x4;  grossly normal neurologically.  Impression/Plan: Russell Walters is here for an colonoscopy to be performed for chronic diarrhea  Risks, benefits, limitations, and alternatives regarding  colonoscopy have been reviewed with the patient.  Questions have been answered.  All parties agreeable.   Elnita MaxwellEIN, Parth Mccormac GORDON, MD  03/15/2015, 9:21 AM

## 2015-03-15 NOTE — Transfer of Care (Signed)
Immediate Anesthesia Transfer of Care Note  Patient: Russell Walters  Procedure(s) Performed: Procedure(s): COLONOSCOPY WITH PROPOFOL (N/A)  Patient Location: Endoscopy Unit  Anesthesia Type:General  Level of Consciousness: awake, alert , oriented and patient cooperative  Airway & Oxygen Therapy: Patient Spontanous Breathing and Patient connected to nasal cannula oxygen  Post-op Assessment: Report given to RN, Post -op Vital signs reviewed and stable and Patient moving all extremities  Post vital signs: Reviewed and stable  Last Vitals:  Filed Vitals:   03/15/15 0836  BP: 140/78  Pulse: 70  Temp: 36.7 C  Resp: 20    Complications: No apparent anesthesia complications

## 2015-03-15 NOTE — Anesthesia Preprocedure Evaluation (Signed)
Anesthesia Evaluation  Patient identified by MRN, date of birth, ID band Patient awake    Reviewed: Allergy & Precautions, NPO status , Patient's Chart, lab work & pertinent test results  Airway Mallampati: I  TM Distance: >3 FB Neck ROM: Limited    Dental  (+) Teeth Intact   Pulmonary PE DVT, PE, on lovenox, coumadin.   Pulmonary exam normal        Cardiovascular hypertension, Normal cardiovascular exam  Denies HTN. HX DVT and PE, anti-coagulated. BMI 46.5.   Neuro/Psych    GI/Hepatic   Endo/Other  Morbid obesity  Renal/GU Renal diseaseRenal cyst, being followed.     Musculoskeletal   Abdominal (+) + obese,   Peds  Hematology   Anesthesia Other Findings   Reproductive/Obstetrics                             Anesthesia Physical Anesthesia Plan  ASA: III  Anesthesia Plan: General   Post-op Pain Management:    Induction: Intravenous  Airway Management Planned: Nasal Cannula  Additional Equipment:   Intra-op Plan:   Post-operative Plan:   Informed Consent: I have reviewed the patients History and Physical, chart, labs and discussed the procedure including the risks, benefits and alternatives for the proposed anesthesia with the patient or authorized representative who has indicated his/her understanding and acceptance.     Plan Discussed with: CRNA  Anesthesia Plan Comments:         Anesthesia Quick Evaluation

## 2015-03-15 NOTE — Op Note (Signed)
[facility] Patient Name: Russell Walters Procedure Date: 03/15/2015 9:23 AM MRN: [id number] Account #: [id number] Date of Birth: February 09, 1966 Admit Type: Outpatient Age: 49 Room: [facility] ENDO ROOM 2 Gender: Male Note Status: Finalized Procedure:         Colonoscopy Indications:       This is the patient's first colonoscopy, , Chronic                     diarrhea, Abnormal CT of the GI tract(sigmoid thickening) Patient Profile:   This is a 49 year old male. Providers:         Rhona Raider. Shelle Iron, MD Referring MD:      Marylin Crosby Greggory Stallion, MD (Referring MD) Medicines:         Propofol per Anesthesia Complications:     No immediate complications. Procedure:         Pre-Anesthesia Assessment:                    - Prior to the procedure, a History and Physical was                     performed, and patient medications, allergies and                     sensitivities were reviewed. The patient's tolerance of                     previous anesthesia was reviewed.                    After obtaining informed consent, the colonoscope was                     passed under direct vision. Throughout the procedure, the                     patient's blood pressure, pulse, and oxygen saturations                     were monitored continuously. The Olympus CF-H180AL                     colonoscope ( S#: [id number] ) was introduced through the                     anus and advanced to the the terminal ileum. The                     colonoscopy was performed without difficulty. The patient                     tolerated the procedure well. The quality of the bowel                     preparation was good. Findings:      The perianal and digital rectal examinations were normal.      A few small-mouthed diverticula were found in the sigmoid colon.      The terminal ileum appeared normal.      A scattered area of mildly granular mucosa was found in the cecum.       Biopsies were  taken with a cold forceps for histology.      Biopsies for histology were taken with a cold forceps from the right  colon, left colon and rectum for evaluation of microscopic colitis.      The exam was otherwise without abnormality on direct and retroflexion       views. Impression:        - Diverticulosis in the sigmoid colon.                    - The examined portion of the ileum was normal.                    - Granular mucosa in the cecum. Biopsied. Likely no                     clinical significance.                    - The examination was otherwise normal on direct and                     retroflexion views.                    - Biopsies were taken with a cold forceps from the right                     colon, left colon and rectum for evaluation of microscopic                     colitis. Recommendation:    - Observe patient in GI recovery unit for observation.                    - Resume regular diet.                    - Continue present medications.                    - Await pathology results.                    - If biopsies are normal, this is likely IBS-D. Would                     consider course of rifaximin for IBS-D.                    - Repeat colonoscopy in 10 years for screening purposes.                    - Return to GI clinic.                    - The findings and recommendations were discussed with the                     patient.                    - The findings and recommendations were discussed with the                     patient's family. Procedure Code(s): --- Professional ---                    [id number], Colonoscopy, flexible; with biopsy, single or                     multiple CPT copyright 2014 [employer].  All rights reserved. The codes documented in this report are preliminary and upon coder review may  be revised to meet current compliance requirements. Kathalene FramesMatthew G Shykeria Sakamoto, MD 03/15/2015 10:00:24 AM This report has been signed  electronically. Number of Addenda: 0 Note Initiated On: 03/15/2015 9:23 AM Scope Withdrawal Time: 0 hours 13 minutes 29 seconds  Total Procedure Duration: 0 hours 17 minutes 25 seconds       [facility]

## 2015-03-15 NOTE — Anesthesia Postprocedure Evaluation (Signed)
  Anesthesia Post-op Note  Patient: Russell BandRobert C Kazmierski  Procedure(s) Performed: Procedure(s): COLONOSCOPY WITH PROPOFOL (N/A)  Anesthesia type:General  Patient location: PACU  Post pain: Pain level controlled  Post assessment: Post-op Vital signs reviewed, Patient's Cardiovascular Status Stable, Respiratory Function Stable, Patent Airway and No signs of Nausea or vomiting  Post vital signs: Reviewed and stable  Last Vitals:  Filed Vitals:   03/15/15 1010  BP: 133/87  Pulse: 75  Temp:   Resp: 16    Level of consciousness: awake, alert  and patient cooperative  Complications: No apparent anesthesia complications

## 2015-03-16 ENCOUNTER — Encounter: Payer: Self-pay | Admitting: Gastroenterology

## 2015-03-16 LAB — SURGICAL PATHOLOGY

## 2017-05-10 IMAGING — US US ABDOMEN LIMITED
1 series · 14 of 25 positions shown · non-contrast
Comparison: None.

CLINICAL DATA: Epigastric pain for 6 hours.

EXAM:
US ABDOMEN LIMITED - RIGHT UPPER QUADRANT

[Series 1: us abdomen limited · 0.31mm/px · 14 of 30 slices shown]
[im 1/30]
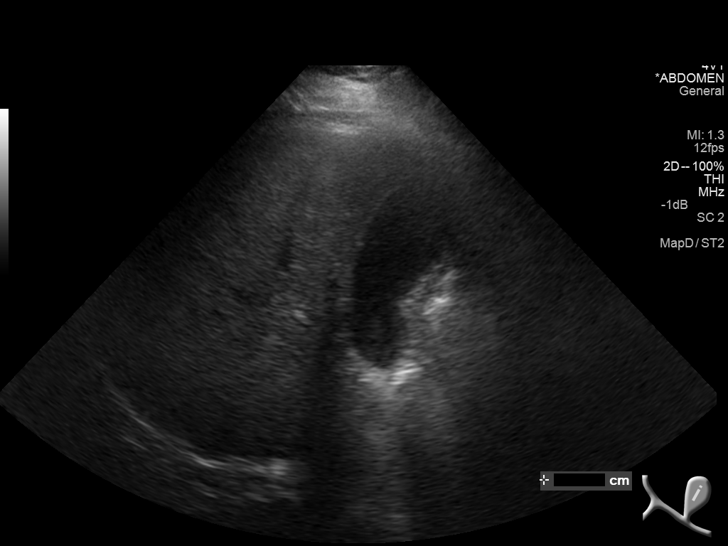
[im 3/30]
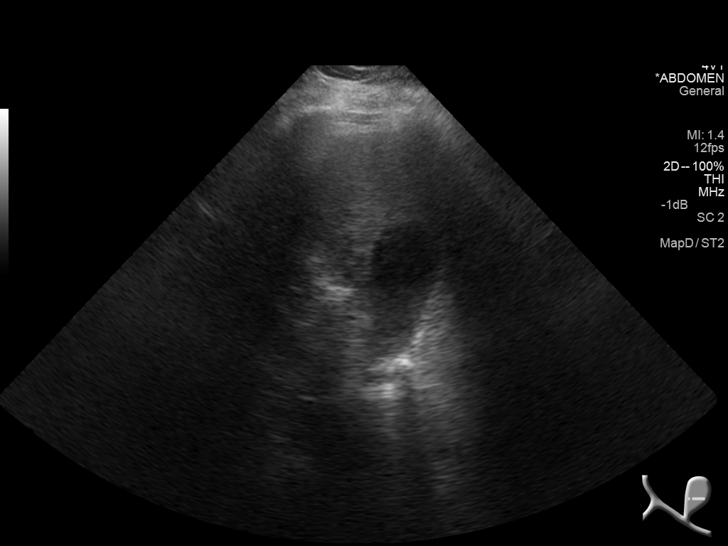
[im 5/30]
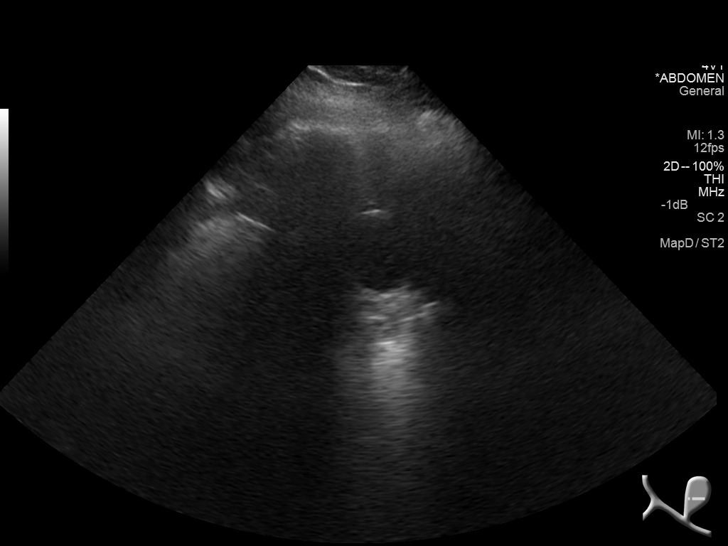
[im 8/30]
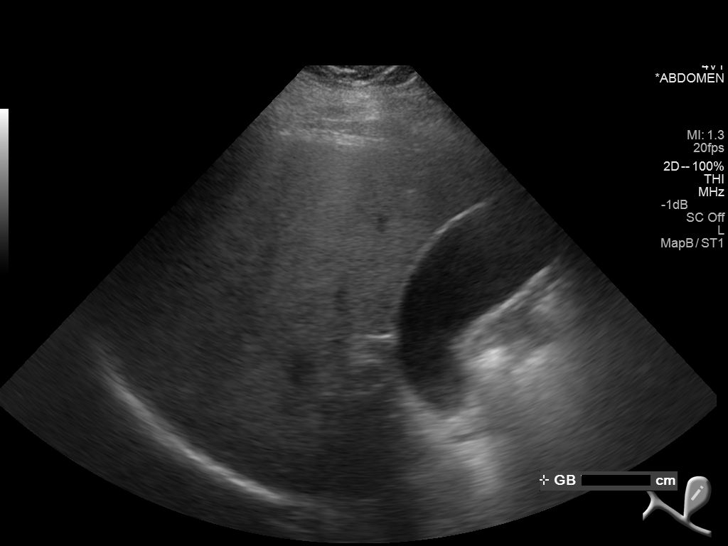
[im 10/30]
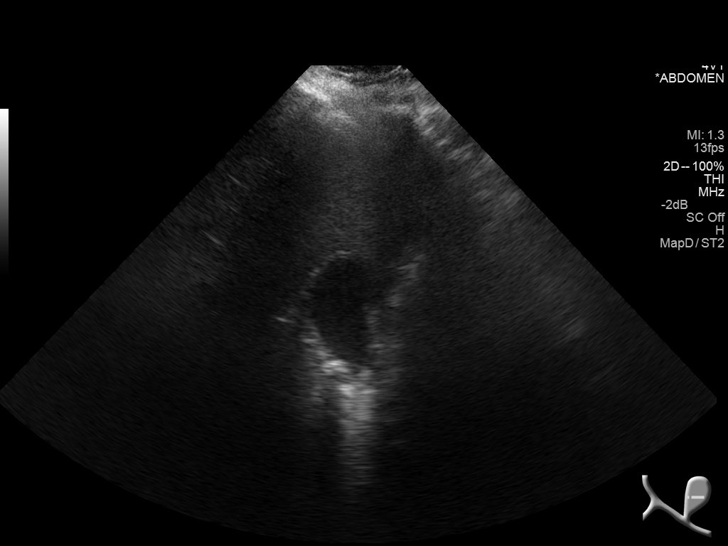
[im 11/30]
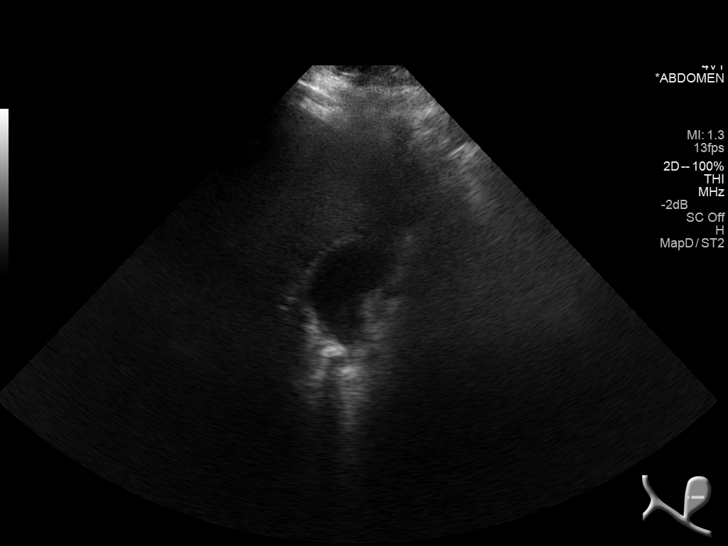
[im 14/30]
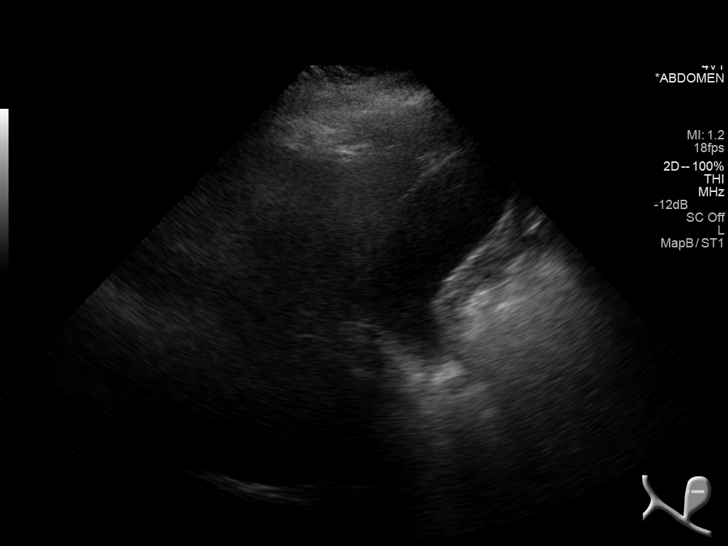
[im 16/30]
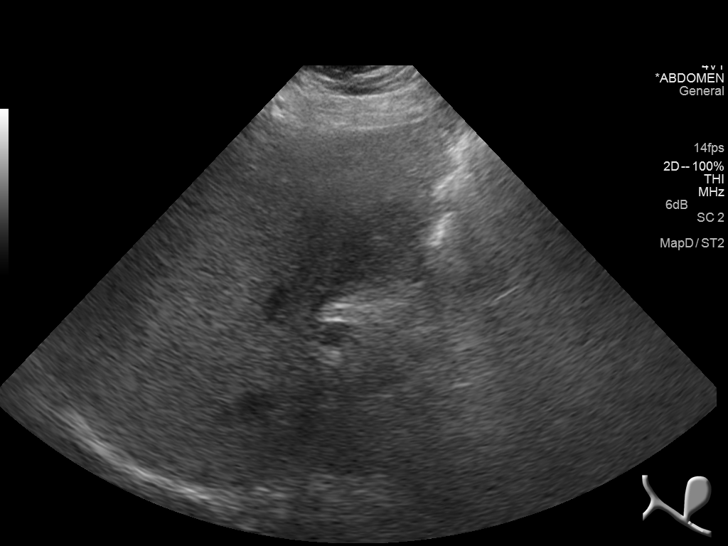
[im 19/30]
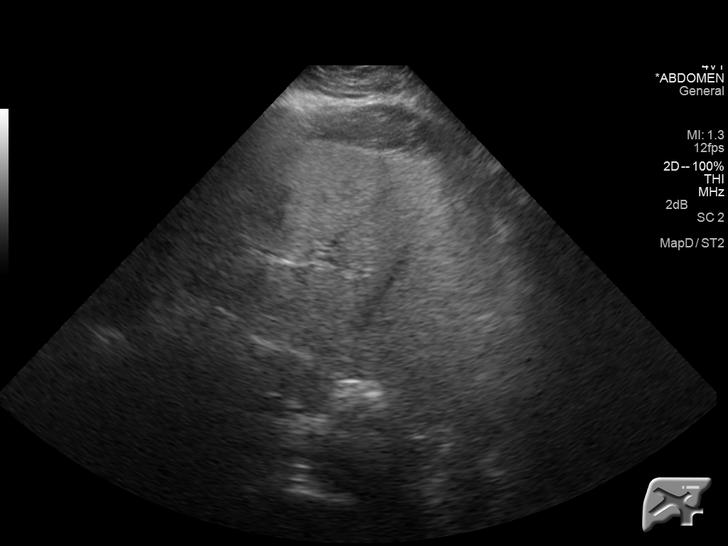
[im 20/30]
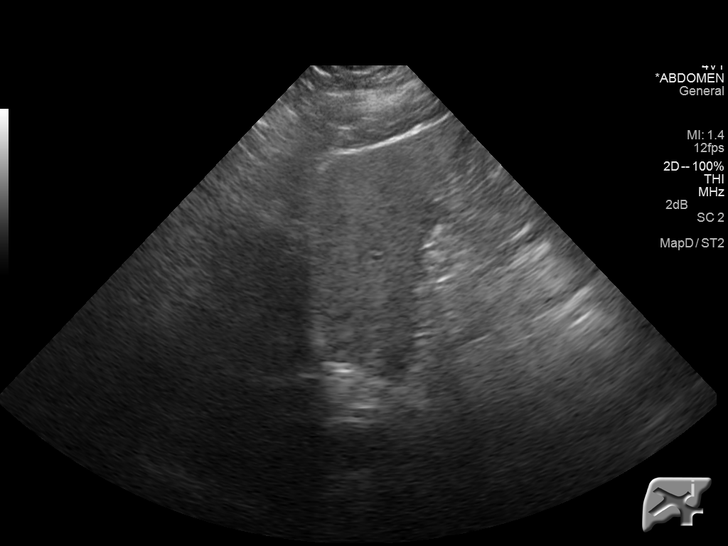
[im 22/30]
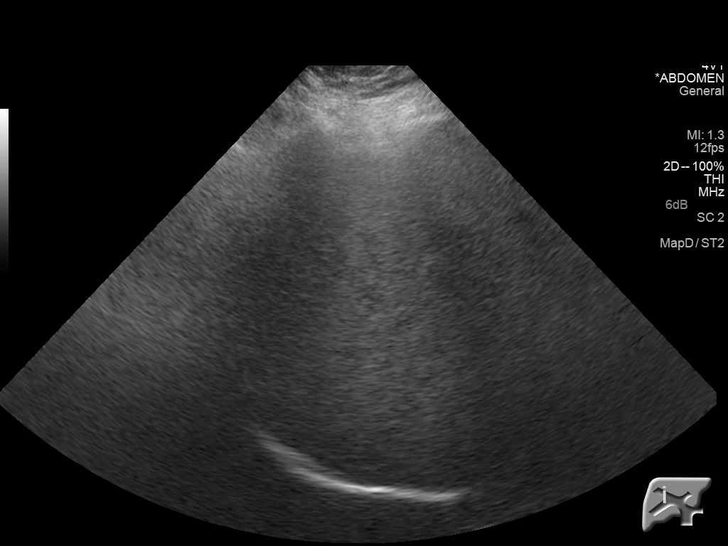
[im 25/30]
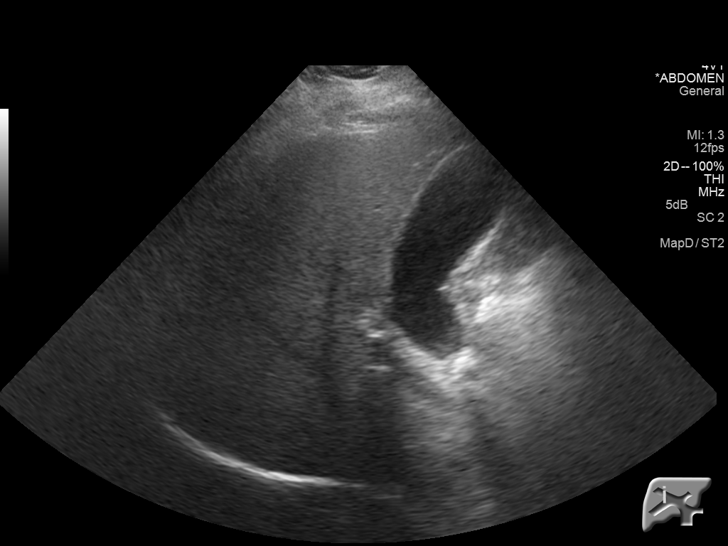
[im 27/30]
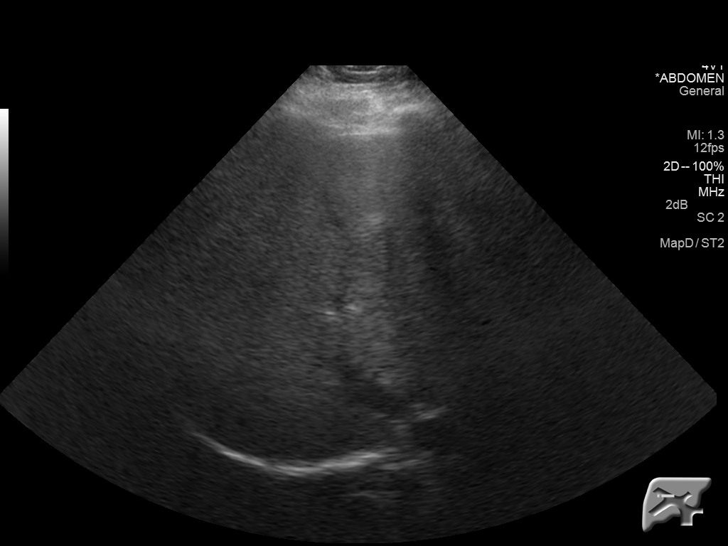
[im 30/30]
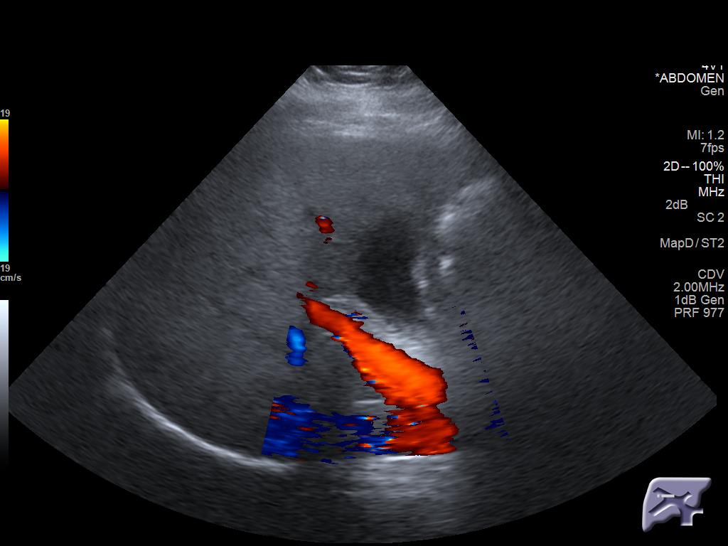

[14 of 25 positions shown; findings below may reference images not displayed]

FINDINGS: Gallbladder:

Non mobile gallstone in the neck measures 1.5 cm. No wall
thickening, wall thickness of 2.4 mm. No sonographic Murphy sign
noted, however patient has been administered pain medication.

Common bile duct:

Diameter: 4 mm

Liver:

No focal lesion identified. Diffusely increased and heterogeneous in
parenchymal echogenicity.

Technically limited and difficult exam due to large body habitus.
IMPRESSION: 1. Non mobile gallstone in the neck. No wall thickening. Negative
sonographic Murphy sign, however limited assessment given
administration of pain medication. No biliary dilatation. Nuclear
medicine HIDA scan could be considered for further evaluation if
there is clinical concern for acute cholecystitis.
2. Hepatic steatosis.
